# Patient Record
Sex: Female | Born: 1935
Health system: Southern US, Community
[De-identification: ages and names within clinical notes are randomized; demographics above are authoritative.]

## PROBLEM LIST (undated history)

## (undated) DIAGNOSIS — K648 Other hemorrhoids: Secondary | ICD-10-CM

## (undated) DIAGNOSIS — I499 Cardiac arrhythmia, unspecified: Secondary | ICD-10-CM

## (undated) DIAGNOSIS — M1990 Unspecified osteoarthritis, unspecified site: Secondary | ICD-10-CM

## (undated) DIAGNOSIS — E785 Hyperlipidemia, unspecified: Secondary | ICD-10-CM

## (undated) DIAGNOSIS — R1310 Dysphagia, unspecified: Secondary | ICD-10-CM

## (undated) DIAGNOSIS — R350 Frequency of micturition: Secondary | ICD-10-CM

## (undated) DIAGNOSIS — I10 Essential (primary) hypertension: Secondary | ICD-10-CM

## (undated) DIAGNOSIS — R609 Edema, unspecified: Secondary | ICD-10-CM

## (undated) DIAGNOSIS — C4490 Unspecified malignant neoplasm of skin, unspecified: Secondary | ICD-10-CM

## (undated) DIAGNOSIS — N8110 Cystocele, unspecified: Secondary | ICD-10-CM

## (undated) DIAGNOSIS — I82439 Acute embolism and thrombosis of unspecified popliteal vein: Secondary | ICD-10-CM

## (undated) DIAGNOSIS — K219 Gastro-esophageal reflux disease without esophagitis: Secondary | ICD-10-CM

## (undated) DIAGNOSIS — F411 Generalized anxiety disorder: Secondary | ICD-10-CM

## (undated) DIAGNOSIS — E559 Vitamin D deficiency, unspecified: Secondary | ICD-10-CM

## (undated) DIAGNOSIS — M810 Age-related osteoporosis without current pathological fracture: Secondary | ICD-10-CM

## (undated) DIAGNOSIS — R1013 Epigastric pain: Secondary | ICD-10-CM

## (undated) DIAGNOSIS — K3189 Other diseases of stomach and duodenum: Secondary | ICD-10-CM

## (undated) DIAGNOSIS — G309 Alzheimer's disease, unspecified: Secondary | ICD-10-CM

## (undated) DIAGNOSIS — G2581 Restless legs syndrome: Secondary | ICD-10-CM

## (undated) DIAGNOSIS — F0280 Dementia in other diseases classified elsewhere without behavioral disturbance: Secondary | ICD-10-CM

## (undated) DIAGNOSIS — D649 Anemia, unspecified: Secondary | ICD-10-CM

## (undated) DIAGNOSIS — D496 Neoplasm of unspecified behavior of brain: Secondary | ICD-10-CM

## (undated) HISTORY — DX: Frequency of micturition: R35.0

## (undated) HISTORY — DX: Cystocele, unspecified: N81.10

## (undated) HISTORY — DX: Generalized anxiety disorder: F41.1

## (undated) HISTORY — DX: Hyperlipidemia, unspecified: E78.5

## (undated) HISTORY — DX: Other diseases of stomach and duodenum: K31.89

## (undated) HISTORY — DX: Edema, unspecified: R60.9

## (undated) HISTORY — DX: Vitamin D deficiency, unspecified: E55.9

## (undated) HISTORY — DX: Epigastric pain: R10.13

## (undated) HISTORY — PX: ESOPHAGEAL DILATION: SHX303

## (undated) HISTORY — DX: Alzheimer's disease, unspecified: G30.9

## (undated) HISTORY — DX: Neoplasm of unspecified behavior of brain: D49.6

## (undated) HISTORY — DX: Anemia, unspecified: D64.9

## (undated) HISTORY — DX: Age-related osteoporosis without current pathological fracture: M81.0

## (undated) HISTORY — DX: Unspecified malignant neoplasm of skin, unspecified: C44.90

## (undated) HISTORY — DX: Other hemorrhoids: K64.8

## (undated) HISTORY — DX: Dysphagia, unspecified: R13.10

## (undated) HISTORY — DX: Dementia in other diseases classified elsewhere, unspecified severity, without behavioral disturbance, psychotic disturbance, mood disturbance, and anxiety: F02.80

## (undated) HISTORY — PX: EYE SURGERY: SHX253

## (undated) HISTORY — DX: Unspecified osteoarthritis, unspecified site: M19.90

## (undated) HISTORY — DX: Restless legs syndrome: G25.81

---
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
## 1970-05-04 HISTORY — PX: APPENDECTOMY: SHX54

## 2000-03-29 ENCOUNTER — Encounter: Payer: Self-pay | Admitting: Internal Medicine

## 2000-03-29 ENCOUNTER — Encounter: Admission: RE | Admit: 2000-03-29 | Discharge: 2000-03-29 | Payer: Self-pay | Admitting: Internal Medicine

## 2001-06-02 ENCOUNTER — Encounter: Payer: Self-pay | Admitting: Gastroenterology

## 2001-06-02 ENCOUNTER — Ambulatory Visit (HOSPITAL_COMMUNITY): Admission: RE | Admit: 2001-06-02 | Discharge: 2001-06-02 | Payer: Self-pay | Admitting: Gastroenterology

## 2001-09-02 ENCOUNTER — Ambulatory Visit (HOSPITAL_COMMUNITY): Admission: RE | Admit: 2001-09-02 | Discharge: 2001-09-02 | Payer: Self-pay | Admitting: Internal Medicine

## 2008-12-17 ENCOUNTER — Encounter: Admission: RE | Admit: 2008-12-17 | Discharge: 2009-01-17 | Payer: Self-pay | Admitting: Internal Medicine

## 2009-05-04 HISTORY — PX: ABDOMINAL HYSTERECTOMY: SHX81

## 2009-09-04 ENCOUNTER — Encounter (INDEPENDENT_AMBULATORY_CARE_PROVIDER_SITE_OTHER): Payer: Self-pay | Admitting: Obstetrics & Gynecology

## 2009-09-04 ENCOUNTER — Ambulatory Visit (HOSPITAL_COMMUNITY): Admission: RE | Admit: 2009-09-04 | Discharge: 2009-09-06 | Payer: Self-pay | Admitting: Obstetrics & Gynecology

## 2010-01-03 ENCOUNTER — Emergency Department (HOSPITAL_BASED_OUTPATIENT_CLINIC_OR_DEPARTMENT_OTHER): Admission: EM | Admit: 2010-01-03 | Discharge: 2010-01-03 | Payer: Self-pay | Admitting: Emergency Medicine

## 2010-05-06 ENCOUNTER — Encounter
Admission: RE | Admit: 2010-05-06 | Discharge: 2010-05-06 | Payer: Self-pay | Source: Home / Self Care | Attending: Internal Medicine | Admitting: Internal Medicine

## 2010-05-06 LAB — HM DEXA SCAN

## 2010-07-22 LAB — CBC
HCT: 31.2 % — ABNORMAL LOW (ref 36.0–46.0)
HCT: 39.6 % (ref 36.0–46.0)
Hemoglobin: 13.4 g/dL (ref 12.0–15.0)
MCHC: 33.8 g/dL (ref 30.0–36.0)
MCV: 94.4 fL (ref 78.0–100.0)
Platelets: 130 10*3/uL — ABNORMAL LOW (ref 150–400)
Platelets: 170 10*3/uL (ref 150–400)
RBC: 4.19 MIL/uL (ref 3.87–5.11)
RDW: 13.6 % (ref 11.5–15.5)
WBC: 5.9 10*3/uL (ref 4.0–10.5)
WBC: 7.2 10*3/uL (ref 4.0–10.5)

## 2010-07-22 LAB — DIFFERENTIAL
Basophils Absolute: 0 10*3/uL (ref 0.0–0.1)
Basophils Relative: 0 % (ref 0–1)
Eosinophils Absolute: 0.1 10*3/uL (ref 0.0–0.7)
Eosinophils Relative: 2 % (ref 0–5)
Lymphocytes Relative: 17 % (ref 12–46)
Lymphs Abs: 1 10*3/uL (ref 0.7–4.0)
Monocytes Absolute: 0.4 10*3/uL (ref 0.1–1.0)
Monocytes Relative: 6 % (ref 3–12)
Neutro Abs: 4.4 10*3/uL (ref 1.7–7.7)
Neutrophils Relative %: 75 % (ref 43–77)

## 2010-07-22 LAB — COMPREHENSIVE METABOLIC PANEL
ALT: 11 U/L (ref 0–35)
ALT: 15 U/L (ref 0–35)
AST: 17 U/L (ref 0–37)
AST: 18 U/L (ref 0–37)
Albumin: 2.9 g/dL — ABNORMAL LOW (ref 3.5–5.2)
Albumin: 3.9 g/dL (ref 3.5–5.2)
Alkaline Phosphatase: 55 U/L (ref 39–117)
Alkaline Phosphatase: 79 U/L (ref 39–117)
BUN: 9 mg/dL (ref 6–23)
BUN: 9 mg/dL (ref 6–23)
CO2: 26 mEq/L (ref 19–32)
Calcium: 9.7 mg/dL (ref 8.4–10.5)
Chloride: 104 mEq/L (ref 96–112)
Chloride: 105 mEq/L (ref 96–112)
Creatinine, Ser: 0.74 mg/dL (ref 0.4–1.2)
GFR calc Af Amer: >60 mL/min (ref >60)
GFR calc Af Amer: >60 mL/min (ref >60)
GFR calc non Af Amer: >60 mL/min (ref >60)
Glucose, Bld: 87 mg/dL (ref 70–99)
Potassium: 4.3 mEq/L (ref 3.5–5.1)
Potassium: 4.3 mEq/L (ref 3.5–5.1)
Sodium: 134 mEq/L — ABNORMAL LOW (ref 135–145)
Sodium: 137 mEq/L (ref 135–145)
Total Bilirubin: 0.4 mg/dL (ref 0.3–1.2)
Total Bilirubin: 0.7 mg/dL (ref 0.3–1.2)
Total Protein: 7.2 g/dL (ref 6.0–8.3)

## 2010-08-26 ENCOUNTER — Ambulatory Visit
Admission: RE | Admit: 2010-08-26 | Discharge: 2010-08-26 | Disposition: A | Discharge status: Home / Self Care | Payer: Medicare Other | Source: Ambulatory Visit | Attending: Internal Medicine | Admitting: Internal Medicine

## 2010-08-26 ENCOUNTER — Other Ambulatory Visit: Payer: Self-pay | Admitting: Internal Medicine

## 2010-08-26 DIAGNOSIS — R0600 Dyspnea, unspecified: Secondary | ICD-10-CM

## 2012-04-28 ENCOUNTER — Ambulatory Visit
Admission: RE | Admit: 2012-04-28 | Discharge: 2012-04-28 | Disposition: A | Discharge status: Home / Self Care | Payer: Medicare Other | Source: Ambulatory Visit | Attending: Gastroenterology | Admitting: Gastroenterology

## 2012-04-28 ENCOUNTER — Other Ambulatory Visit: Payer: Self-pay | Admitting: Gastroenterology

## 2012-04-28 DIAGNOSIS — R1310 Dysphagia, unspecified: Secondary | ICD-10-CM

## 2012-05-03 ENCOUNTER — Other Ambulatory Visit: Payer: Self-pay | Admitting: Gastroenterology

## 2012-05-05 ENCOUNTER — Encounter (HOSPITAL_COMMUNITY): Payer: Self-pay | Admitting: *Deleted

## 2012-05-05 NOTE — Pre-Procedure Instructions (Signed)
Your procedure is scheduled WR:[id number], May 17, 2012 Report to [facility] Admitting WJ:[id number] Call this number if you have problems morning of your procedure:[phone]  Follow all bowel prep instructions per your doctor's orders.  Do not eat or drink anything after midnight the night before your procedure. You may brush your teeth, rinse out your mouth, but no water, no food, no chewing gum, no mints, no candies, no chewing tobacco.     Take these medicines the morning of your procedure with A SIP OF WATER:Prilosec   Please make arrangements for a responsible person to drive you home after the procedure. You cannot go home by cab/taxi. We recommend you have someone with you at home the first 24 hours after your procedure. Driver for procedure is Water engineer and son Acey Lav ALL VALUABLES, JEWELRY, BILLFOLD AT HOME.  NO DENTURES, CONTACT LENSES ALLOWED IN THE ENDOSCOPY ROOM.   YOU MAY WEAR DEODORANT, PLEASE REMOVE ALL JEWELRY, WATCHES RINGS, BODY PIERCINGS AND LEAVE AT HOME.   WOMEN: NO MAKE-UP, LOTIONS PERFUMES

## 2012-05-06 ENCOUNTER — Encounter (HOSPITAL_COMMUNITY): Payer: Self-pay | Admitting: Pharmacy Technician

## 2012-05-06 ENCOUNTER — Encounter (HOSPITAL_COMMUNITY): Payer: Self-pay | Admitting: *Deleted

## 2012-05-17 ENCOUNTER — Ambulatory Visit (HOSPITAL_COMMUNITY)
Admission: RE | Admit: 2012-05-17 | Discharge: 2012-05-17 | Disposition: A | Discharge status: Home / Self Care | Payer: Medicare Other | Source: Ambulatory Visit | Attending: Gastroenterology | Admitting: Gastroenterology

## 2012-05-17 ENCOUNTER — Encounter (HOSPITAL_COMMUNITY)
Admission: RE | Disposition: A | Discharge status: Home / Self Care | Payer: Self-pay | Source: Ambulatory Visit | Attending: Gastroenterology

## 2012-05-17 ENCOUNTER — Ambulatory Visit (HOSPITAL_COMMUNITY): Payer: Medicare Other | Admitting: Anesthesiology

## 2012-05-17 ENCOUNTER — Encounter (HOSPITAL_COMMUNITY): Payer: Self-pay | Admitting: Anesthesiology

## 2012-05-17 ENCOUNTER — Encounter (HOSPITAL_COMMUNITY): Payer: Self-pay | Admitting: Gastroenterology

## 2012-05-17 DIAGNOSIS — K222 Esophageal obstruction: Secondary | ICD-10-CM | POA: Insufficient documentation

## 2012-05-17 DIAGNOSIS — Z79899 Other long term (current) drug therapy: Secondary | ICD-10-CM | POA: Insufficient documentation

## 2012-05-17 DIAGNOSIS — R1310 Dysphagia, unspecified: Secondary | ICD-10-CM | POA: Insufficient documentation

## 2012-05-17 DIAGNOSIS — K220 Achalasia of cardia: Secondary | ICD-10-CM | POA: Insufficient documentation

## 2012-05-17 HISTORY — PX: ESOPHAGOGASTRODUODENOSCOPY (EGD) WITH PROPOFOL: SHX5813

## 2012-05-17 HISTORY — DX: Cardiac arrhythmia, unspecified: I49.9

## 2012-05-17 HISTORY — DX: Essential (primary) hypertension: I10

## 2012-05-17 HISTORY — DX: Gastro-esophageal reflux disease without esophagitis: K21.9

## 2012-05-17 SURGERY — ESOPHAGOGASTRODUODENOSCOPY (EGD) WITH PROPOFOL
Anesthesia: Monitor Anesthesia Care

## 2012-05-17 MED ORDER — BUTAMBEN-TETRACAINE-BENZOCAINE 2-2-14 % EX AERO
INHALATION_SPRAY | CUTANEOUS | Status: DC | PRN
  Administered 2012-05-17: 2 via TOPICAL

## 2012-05-17 MED ORDER — SODIUM CHLORIDE 0.9 % IV SOLN: INTRAVENOUS | Status: DC

## 2012-05-17 MED ORDER — FENTANYL CITRATE 0.05 MG/ML IJ SOLN
INTRAMUSCULAR | Status: DC | PRN
  Administered 2012-05-17: 25 ug via INTRAVENOUS

## 2012-05-17 MED ORDER — ONABOTULINUMTOXINA 100 UNITS IJ SOLR
100.0000 [IU] | INTRAMUSCULAR | Status: AC
  Administered 2012-05-17: 100 [IU] via INTRAMUSCULAR
  Filled 2012-05-17: qty 100

## 2012-05-17 MED ORDER — PROPOFOL 10 MG/ML IV EMUL
INTRAVENOUS | Status: DC | PRN
  Administered 2012-05-17: 50 ug/kg/min via INTRAVENOUS

## 2012-05-17 MED ORDER — LACTATED RINGERS IV SOLN
INTRAVENOUS | Status: DC
  Administered 2012-05-17: 1000 mL via INTRAVENOUS

## 2012-05-17 MED ORDER — LACTATED RINGERS IV SOLN
INTRAVENOUS | Status: DC
  Administered 2012-05-17: 14:00:00 via INTRAVENOUS

## 2012-05-17 MED ORDER — FENTANYL CITRATE 0.05 MG/ML IJ SOLN: 25.0000 ug | INTRAMUSCULAR | Status: DC | PRN

## 2012-05-17 SURGICAL SUPPLY — 15 items

## 2012-05-17 NOTE — Anesthesia Postprocedure Evaluation (Signed)
  Anesthesia Post-op Note  Patient: Cynthia Shepard  Procedure(s) Performed: Procedure(s) (LRB): ESOPHAGOGASTRODUODENOSCOPY (EGD) WITH PROPOFOL (N/A)  Patient Location: PACU  Anesthesia Type: MAC  Level of Consciousness: awake and alert   Airway and Oxygen Therapy: Patient Spontanous Breathing  Post-op Pain: mild  Post-op Assessment: Post-op Vital signs reviewed, Patient's Cardiovascular Status Stable, Respiratory Function Stable, Patent Airway and No signs of Nausea or vomiting  Last Vitals:  Filed Vitals:   05/17/12 1349  BP: 159/62  Pulse: 54  Temp: 36.6 C  Resp: 17    Post-op Vital Signs: stable   Complications: No apparent anesthesia complications

## 2012-05-17 NOTE — Op Note (Signed)
Procedure: Diagnostic esophagogastroduodenoscopy, Botox injection into the lower esophageal sphincter muscle, balloon esophageal dilation.  Indication: Dysphagia post Heller myotomy for achalasia  Endoscopist: Danise Edge  Premedication: Propofol administered by anesthesia  Procedure: The patient was placed in the left lateral decubitus position. The Pentax gastroscope was passed through the posterior hypopharynx into the proximal esophagus without difficulty. The hypopharynx, larynx, and vocal cords were not visualized.  Esophagoscopy: The esophageal body is quite dilated with retained clear liquid. The proximal, mid, and lower segments of the esophageal mucosa appear normal except for erosive esophagitis at the esophagogastric junction which is noted at approximately 40 cm from the incisor teeth. There is no evidence for the presence of an esophageal stricture or esophageal tumor.  Gastroscopy: Retroflex view of the gastric cardia and fundus was normal. The gastric body, antrum, and pylorus appeared normal.  Duodenoscopy: The duodenal bulb and descending duodenum appeared normal.  Botox injection: A total of 100 units of Botox was injected at the esophagogastric junction into the lower esophageal sphincter muscle. Four-quadrant injections were performed using 25 units per injection.  Esophageal dilation: Balloon esophageal dilation was performed to 18 mm using the esophageal balloon dilator and confirm the absence of an esophageal stricture.  Assessment: Achalasia associated with distal esophageal obstruction at the esophagogastric junction treated with Botox injection. There is no endoscopic evidence for the presence of esophageal stricture formation or esophageal cancer.

## 2012-05-17 NOTE — Anesthesia Preprocedure Evaluation (Signed)
Anesthesia Evaluation  Patient identified by MRN, date of birth, ID band Patient awake    Reviewed: Allergy & Precautions, H&P , NPO status , Patient's Chart, lab work & pertinent test results, reviewed documented beta blocker date and time   Airway Mallampati: II TM Distance: >3 FB Neck ROM: full    Dental No notable dental hx.    Pulmonary neg pulmonary ROS,  breath sounds clear to auscultation  Pulmonary exam normal       Cardiovascular hypertension, Pt. on medications Rhythm:regular Rate:Normal  Hx. bradycardia   Neuro/Psych negative neurological ROS  negative psych ROS   GI/Hepatic negative GI ROS, Neg liver ROS, GERD-  Controlled,  Endo/Other  negative endocrine ROS  Renal/GU negative Renal ROS  negative genitourinary   Musculoskeletal   Abdominal   Peds  Hematology negative hematology ROS (+)   Anesthesia Other Findings   Reproductive/Obstetrics negative OB ROS                           Anesthesia Physical Anesthesia Plan  ASA: III  Anesthesia Plan: MAC   Post-op Pain Management:    Induction:   Airway Management Planned:   Additional Equipment:   Intra-op Plan:   Post-operative Plan:   Informed Consent: I have reviewed the patients History and Physical, chart, labs and discussed the procedure including the risks, benefits and alternatives for the proposed anesthesia with the patient or authorized representative who has indicated his/her understanding and acceptance.   Dental Advisory Given  Plan Discussed with: CRNA and Surgeon  Anesthesia Plan Comments:         Anesthesia Quick Evaluation

## 2012-05-17 NOTE — Transfer of Care (Signed)
Immediate Anesthesia Transfer of Care Note  Patient: Cynthia Shepard  Procedure(s) Performed: Procedure(s) (LRB): ESOPHAGOGASTRODUODENOSCOPY (EGD) WITH PROPOFOL (N/A)  Patient Location: PACU  Anesthesia Type: MAC  Level of Consciousness: sedated, patient cooperative and responds to stimulaton  Airway & Oxygen Therapy: Patient Spontanous Breathing and Patient connected to face mask oxgen  Post-op Assessment: Report given to PACU RN and Post -op Vital signs reviewed and stable  Post vital signs: Reviewed and stable  Complications: No apparent anesthesia complications

## 2012-05-17 NOTE — H&P (Signed)
  Problem: Dysphagia with a distal esophageal obstruction.  History: The patient is a 79 year old female born 1919-09-28. The patient underwent a Heller myotomy to treat achalasia in the 1950s. She underwent a diagnostic esophagogastroduodenoscopy with dilation of a benign peptic stricture at the esophagogastric junction in 2003.  The patient has developed esophageal dysphagia without odynophagia, nausea, or vomiting. She was recently prescribed omeprazole.  The patient underwent a barium esophagram which showed distal esophageal obstruction at the esophagogastric junction most likely secondary to a stricture and aperistalsis of the esophagus associated with achalasia.  The patient is scheduled to undergo a diagnostic esophagogastroduodenoscopy with esophageal dilation.  Current medications: Omeprazole  Past medical and surgical history: Achalasia. Heller myotomy. Hysterectomy. Distal esophageal stricture. History of chronic  Exam: The patient is alert and lying comfortably on the endoscopy stretcher. Lungs are clear to auscultation. Cardiac exam reveals a regular rhythm. Abdomen is soft, flat, and nontender to palpation in all quadrants.  Plan: Proceed with diagnostic esophagogastroduodenoscopy and distal esophageal dilation to evaluate and treat distal esophageal obstruction.

## 2012-05-18 ENCOUNTER — Encounter (HOSPITAL_COMMUNITY): Payer: Self-pay | Admitting: Gastroenterology

## 2012-10-10 ENCOUNTER — Other Ambulatory Visit (HOSPITAL_BASED_OUTPATIENT_CLINIC_OR_DEPARTMENT_OTHER): Payer: Self-pay | Admitting: Internal Medicine

## 2012-11-01 ENCOUNTER — Encounter: Payer: Self-pay | Admitting: *Deleted

## 2012-11-02 ENCOUNTER — Encounter: Payer: Self-pay | Admitting: Nurse Practitioner

## 2012-11-02 ENCOUNTER — Other Ambulatory Visit: Payer: Self-pay | Admitting: *Deleted

## 2012-11-02 ENCOUNTER — Ambulatory Visit (INDEPENDENT_AMBULATORY_CARE_PROVIDER_SITE_OTHER): Payer: Medicare Other | Admitting: Nurse Practitioner

## 2012-11-02 VITALS — BP 160/82 | HR 75 | Temp 97.4°F | Resp 22 | Ht 66.0 in | Wt 193.0 lb

## 2012-11-02 DIAGNOSIS — F411 Generalized anxiety disorder: Secondary | ICD-10-CM

## 2012-11-02 DIAGNOSIS — I10 Essential (primary) hypertension: Secondary | ICD-10-CM

## 2012-11-02 DIAGNOSIS — K5900 Constipation, unspecified: Secondary | ICD-10-CM

## 2012-11-02 DIAGNOSIS — K921 Melena: Secondary | ICD-10-CM

## 2012-11-02 MED ORDER — CITALOPRAM HYDROBROMIDE 20 MG PO TABS: 20.0000 mg | ORAL_TABLET | Freq: Every day | ORAL | Status: DC

## 2012-11-02 MED ORDER — ALPRAZOLAM 0.25 MG PO TABS: 0.1250 mg | ORAL_TABLET | Freq: Two times a day (BID) | ORAL | Status: DC

## 2012-11-02 MED ORDER — ALPRAZOLAM 0.25 MG PO TABS: 0.1250 mg | ORAL_TABLET | Freq: Every evening | ORAL | Status: DC | PRN

## 2012-11-02 NOTE — Patient Instructions (Addendum)
Will get a referral to gastroenterology due to blood in stool    Take a stool softener (colace) 100 mg twice daily to help with constipation Increase water intake    Will start daily medication to help anxiety -- this is at the pharmacy     Constipation, Adult Constipation is when a person has fewer than 3 bowel movements a week; has difficulty having a bowel movement; or has stools that are dry, hard, or larger than normal. As people grow older, constipation is more common. If you try to fix constipation with medicines that make you have a bowel movement (laxatives), the problem may get worse. Long-term laxative use may cause the muscles of the colon to become weak. A low-fiber diet, not taking in enough fluids, and taking certain medicines may make constipation worse. CAUSES   Certain medicines, such as antidepressants, pain medicine, iron supplements, antacids, and water pills.   Certain diseases, such as diabetes, irritable bowel syndrome (IBS), thyroid disease, or depression.   Not drinking enough water.   Not eating enough fiber-rich foods.   Stress or travel.  Lack of physical activity or exercise.  Not going to the restroom when there is the urge to have a bowel movement.  Ignoring the urge to have a bowel movement.  Using laxatives too much. SYMPTOMS   Having fewer than 3 bowel movements a week.   Straining to have a bowel movement.   Having hard, dry, or larger than normal stools.   Feeling full or bloated.   Pain in the lower abdomen.  Not feeling relief after having a bowel movement. DIAGNOSIS  Your caregiver will take a medical history and perform a physical exam. Further testing may be done for severe constipation. Some tests may include:   A barium enema X-ray to examine your rectum, colon, and sometimes, your small intestine.  A sigmoidoscopy to examine your lower colon.  A colonoscopy to examine your entire colon. TREATMENT  Treatment  will depend on the severity of your constipation and what is causing it. Some dietary treatments include drinking more fluids and eating more fiber-rich foods. Lifestyle treatments may include regular exercise. If these diet and lifestyle recommendations do not help, your caregiver may recommend taking over-the-counter laxative medicines to help you have bowel movements. Prescription medicines may be prescribed if over-the-counter medicines do not work.  HOME CARE INSTRUCTIONS   Increase dietary fiber in your diet, such as fruits, vegetables, whole grains, and beans. Limit high-fat and processed sugars in your diet, such as [place] fries, hamburgers, cookies, candies, and soda.   A fiber supplement may be added to your diet if you cannot get enough fiber from foods.   Drink enough fluids to keep your urine clear or pale yellow.   Exercise regularly or as directed by your caregiver.   Go to the restroom when you have the urge to go. Do not hold it.  Only take medicines as directed by your caregiver. Do not take other medicines for constipation without talking to your caregiver first. SEEK IMMEDIATE MEDICAL CARE IF:   You have bright red blood in your stool.   Your constipation lasts for more than 4 days or gets worse.   You have abdominal or rectal pain.   You have thin, pencil-like stools.  You have unexplained weight loss. MAKE SURE YOU:   Understand these instructions.  Will watch your condition.  Will get help right away if you are not doing well or get worse. Document  Released: 01/17/2004 Document Revised: 07/13/2011 Document Reviewed: 03/24/2011 [facility] Patient Information 2014 [place], [place].

## 2012-11-02 NOTE — Progress Notes (Signed)
Patient ID: Cynthia Shepard, female   DOB: 1919/07/17, 79 y.o.   MRN: [id number]   No Known Allergies  Chief Complaint  Patient presents with  . Medical Managment of Chronic Issues    no new problems    HPI: Patient is a 79 y.o. female seen in the office today for medication refill Has not been here in several months was a pt of Dr Robson's  Needs refill on xanax to help her sleep- reports she still has a few and takes 1/2 tablet every night If she goes without she is awake all night. Reports she also takes xanax when she has a panic attack which is not very often but she likes to have them because she is a nervous person. Reports this effects her everyday and contributes to why she can not sleep. And sometimes is depressed but mostly nevous  Sees Dr Jacinto Halim for cardiology and reports she needs to follow up with him    Reports everything is "fine" and there are no new concerns however she reports she is having blood in her stool for over a month now, which is on and off. Reports moderate amount in toilet and on tissue. No history of hemorrhoids per pt Denies lightheadedness, dizziness, nausea or vomiting, no fevers or chills    Review of Systems:  Review of Systems  Constitutional: Negative for fever and chills.       Some days she feels weak and does not want to do anything.  Respiratory: Positive for shortness of breath (with increase activity- this is chronic). Negative for cough.   Cardiovascular: Positive for leg swelling. Negative for chest pain.  Gastrointestinal: Positive for constipation. Negative for abdominal pain and diarrhea.  Genitourinary: Negative for dysuria, urgency and frequency.  Musculoskeletal: Negative.   Skin: Negative.   Neurological: Negative for dizziness, weakness and headaches.  Psychiatric/Behavioral: Positive for depression. The patient is nervous/anxious and has insomnia.      Past Medical History  Diagnosis Date  . Hypertension   . GERD  (gastroesophageal reflux disease)   . Arthritis   . Dysrhythmia     Bradycardia, 1st degree AV block  . Dyspepsia and other specified disorders of function of stomach   . Other dyspnea and respiratory abnormality   . Other malaise and fatigue   . Osteoporosis, unspecified   . Unspecified prolapse of vaginal walls   . Edema   . Alzheimer's disease   . Other and unspecified hyperlipidemia   . Unspecified vitamin D deficiency   . Osteoarthrosis, unspecified whether generalized or localized, unspecified site   . Anemia, unspecified   . Anxiety state, unspecified   . Restless legs syndrome (RLS)   . Internal hemorrhoids without mention of complication   . Dysphagia, unspecified(787.20)   . Urinary frequency   . Nervousness(799.21)    Past Surgical History  Procedure Laterality Date  . Abdominal hysterectomy  2011  . Eye surgery      catarct extraction bilateral  . Esophagogastroduodenoscopy (egd) with propofol  05/17/2012    Procedure: ESOPHAGOGASTRODUODENOSCOPY (EGD) WITH PROPOFOL;  Surgeon: Charolett Bumpers, MD;  Location: [facility];  Service: Endoscopy;  Laterality: N/A;  . Thyroid surgery  1954  . Appendectomy  1972   Social History:   reports that she has never smoked. She has never used smokeless tobacco. She reports that she does not drink alcohol or use illicit drugs.  No family history on file.  Medications: Patient's Medications  New Prescriptions  No medications on file  Previous Medications   ALISKIREN (TEKTURNA) 150 MG TABLET    Take 150 mg by mouth every morning.   HYDRALAZINE (APRESOLINE) 25 MG TABLET    Take 25 mg by mouth 2 (two) times daily.  Modified Medications   Modified Medication Previous Medication   ALPRAZOLAM (XANAX) 0.25 MG TABLET ALPRAZolam (XANAX) 0.25 MG tablet      Take 0.5 tablets (0.125 mg total) by mouth 2 (two) times daily.    Take 0.125 mg by mouth 2 (two) times daily.  Discontinued Medications   OMEPRAZOLE (PRILOSEC) 20 MG CAPSULE     Take 20 mg by mouth every morning.     Physical Exam:  Filed Vitals:   11/02/12 1529  BP: 160/82  Pulse: 75  Temp: 97.4 F (36.3 C)  TempSrc: Oral  Resp: 22  Height: 5\' 6"  (1.676 m)  Weight: 193 lb (87.544 kg)  SpO2: 96%    Physical Exam  Vitals reviewed. Constitutional: She is oriented to person, place, and time and well-developed, well-nourished, and in no distress. No distress.  HENT:  Head: Normocephalic and atraumatic.  Mouth/Throat: Oropharynx is clear and moist. No oropharyngeal exudate.  Eyes: Conjunctivae and EOM are normal. Pupils are equal, round, and reactive to light.  Neck: Normal range of motion. Neck supple. No thyromegaly present.  Cardiovascular: Normal rate, regular rhythm and normal heart sounds.   Pulmonary/Chest: Effort normal and breath sounds normal.  Abdominal: Soft. Bowel sounds are normal. She exhibits no distension. There is no tenderness.  Genitourinary: Rectal exam shows no external hemorrhoid. Guaiac positive stool.  Musculoskeletal: Normal range of motion. She exhibits no edema and no tenderness.  Neurological: She is alert and oriented to person, place, and time.  Skin: Skin is warm and dry. She is not diaphoretic.       Assessment/Plan Melena-   Grossly positive for blood in stool- will send to GI at this time; will get cbc Anxiety- will refill xanax 0.25 mg 1/2 tablet qhs and as needed for anxiety attacks  Will also start celexa to help with daily anxiety  Hypertension-   Will get blood work today cont current medications Constipation-   Take a stool softener (colace) 100 mg twice daily to help with constipation  Increase water intake    To follow up in 1 month

## 2012-11-03 LAB — COMPREHENSIVE METABOLIC PANEL
Albumin/Globulin Ratio: 1.7 (ref 1.1–2.5)
Albumin: 4.3 g/dL (ref 3.2–4.6)
BUN/Creatinine Ratio: 16 (ref 11–26)
BUN: 13 mg/dL (ref 10–36)
CO2: 25 mmol/L (ref 18–29)
Calcium: 9.4 mg/dL (ref 8.6–10.2)
Creatinine, Ser: 0.79 mg/dL (ref 0.57–1.00)
GFR calc non Af Amer: 66 mL/min/{1.73_m2} (ref >59)
Globulin, Total: 2.6 g/dL (ref 1.5–4.5)
Sodium: 140 mmol/L (ref 134–144)

## 2012-11-03 LAB — CBC WITH DIFFERENTIAL
Basos: 0 % (ref 0–3)
Eos: 1 % (ref 0–5)
Eosinophils Absolute: 0.1 10*3/uL (ref 0.0–0.4)
HCT: 36.4 % (ref 34.0–46.6)
Hemoglobin: 11.8 g/dL (ref 11.1–15.9)
Immature Grans (Abs): 0 10*3/uL (ref 0.0–0.1)
MCH: 30.5 pg (ref 26.6–33.0)
MCHC: 32.4 g/dL (ref 31.5–35.7)
Monocytes Absolute: 0.5 10*3/uL (ref 0.1–0.9)
Neutrophils Absolute: 4.2 10*3/uL (ref 1.4–7.0)
Neutrophils Relative %: 70 % (ref 40–74)
RBC: 3.87 x10E6/uL (ref 3.77–5.28)

## 2012-11-03 LAB — TSH: TSH: 2.04 u[IU]/mL (ref 0.450–4.500)

## 2012-11-08 DIAGNOSIS — F411 Generalized anxiety disorder: Secondary | ICD-10-CM | POA: Insufficient documentation

## 2012-12-07 ENCOUNTER — Ambulatory Visit: Payer: Medicare Other | Admitting: Nurse Practitioner

## 2012-12-22 ENCOUNTER — Other Ambulatory Visit: Payer: Self-pay | Admitting: Gastroenterology

## 2013-02-01 ENCOUNTER — Encounter: Payer: Self-pay | Admitting: Nurse Practitioner

## 2013-02-01 ENCOUNTER — Ambulatory Visit (INDEPENDENT_AMBULATORY_CARE_PROVIDER_SITE_OTHER): Payer: Medicare Other | Admitting: Nurse Practitioner

## 2013-02-01 VITALS — BP 164/78 | HR 74 | Temp 98.0°F | Wt 192.4 lb

## 2013-02-01 DIAGNOSIS — Z23 Encounter for immunization: Secondary | ICD-10-CM

## 2013-02-01 DIAGNOSIS — I10 Essential (primary) hypertension: Secondary | ICD-10-CM | POA: Insufficient documentation

## 2013-02-01 DIAGNOSIS — K921 Melena: Secondary | ICD-10-CM

## 2013-02-01 DIAGNOSIS — F411 Generalized anxiety disorder: Secondary | ICD-10-CM

## 2013-02-01 DIAGNOSIS — K219 Gastro-esophageal reflux disease without esophagitis: Secondary | ICD-10-CM

## 2013-02-01 MED ORDER — PANTOPRAZOLE SODIUM 40 MG PO TBEC: 40.0000 mg | DELAYED_RELEASE_TABLET | Freq: Every day | ORAL | Status: DC

## 2013-02-01 MED ORDER — CITALOPRAM HYDROBROMIDE 20 MG PO TABS: 20.0000 mg | ORAL_TABLET | Freq: Every day | ORAL | Status: DC

## 2013-02-01 NOTE — Progress Notes (Signed)
Patient ID: Cynthia Shepard, female   DOB: [birth year], 79 y.o.   MRN: [id number]   No Known Allergies  Chief Complaint  Patient presents with  . Medical Managment of Chronic Issues    follow-up form Dr Laural Benes    HPI: Patient is a 79 y.o. female seen in the office today for routine follow up.  Since her last visit she was seen with GI due to melena; this has since resolved (was following with Dr Laural Benes at [facility]) and she has no no recurrent episodes.  Was started on celexa and reports this has helped with her anxiety; has only needed one tablet of  xanax since she has been on celexa.  Has followed up with Dr Jacinto Halim in the past; reports she does not wish to go back  Review of Systems:  Review of Systems  Constitutional: Negative for fever, chills and malaise/fatigue.  Respiratory: Positive for cough (dry cough) and shortness of breath (chronic shortness of breath).   Cardiovascular: Negative for chest pain.  Gastrointestinal: Positive for heartburn. Negative for diarrhea and constipation.  Genitourinary: Negative for dysuria, urgency and frequency.  Musculoskeletal: Negative for myalgias, back pain and falls.  Neurological: Negative for weakness.  Psychiatric/Behavioral: Negative for depression. The patient has insomnia (some nights are better than others). The patient is not nervous/anxious.      Past Medical History  Diagnosis Date  . Hypertension   . GERD (gastroesophageal reflux disease)   . Arthritis   . Dysrhythmia     Bradycardia, 1st degree AV block  . Dyspepsia and other specified disorders of function of stomach   . Other dyspnea and respiratory abnormality   . Other malaise and fatigue   . Osteoporosis, unspecified   . Unspecified prolapse of vaginal walls   . Edema   . Alzheimer's disease   . Other and unspecified hyperlipidemia   . Unspecified vitamin D deficiency   . Osteoarthrosis, unspecified whether generalized or localized, unspecified site   .  Anemia, unspecified   . Anxiety state, unspecified   . Restless legs syndrome (RLS)   . Internal hemorrhoids without mention of complication   . Dysphagia, unspecified(787.20)   . Urinary frequency   . Nervousness(799.21)    Past Surgical History  Procedure Laterality Date  . Abdominal hysterectomy  2011  . Eye surgery      catarct extraction bilateral  . Esophagogastroduodenoscopy (egd) with propofol  05/17/2012    Procedure: ESOPHAGOGASTRODUODENOSCOPY (EGD) WITH PROPOFOL;  Surgeon: Charolett Bumpers, MD;  Location: [facility];  Service: Endoscopy;  Laterality: N/A;  . Thyroid surgery  1954  . Appendectomy  1972   Social History:   reports that she has never smoked. She has never used smokeless tobacco. She reports that she does not drink alcohol or use illicit drugs.  History reviewed. No pertinent family history.  Medications: Patient's Medications  New Prescriptions   No medications on file  Previous Medications   ALISKIREN (TEKTURNA) 150 MG TABLET    Take 150 mg by mouth every morning.   ALPRAZOLAM (XANAX) 0.25 MG TABLET    Take 0.5 tablets (0.125 mg total) by mouth at bedtime as needed for sleep.   CITALOPRAM (CELEXA) 20 MG TABLET    Take 1 tablet (20 mg total) by mouth daily.   HYDRALAZINE (APRESOLINE) 25 MG TABLET    Take 25 mg by mouth 2 (two) times daily.  Modified Medications   No medications on file  Discontinued Medications   No medications  on file     Physical Exam:  Filed Vitals:   02/01/13 1409  BP: 164/78  Pulse: 74  Temp: 98 F (36.7 C)  TempSrc: Oral  Weight: 192 lb 6.4 oz (87.272 kg)  SpO2: 98%   Physical Exam  Constitutional: She is oriented to person, place, and time and well-developed, well-nourished, and in no distress. No distress.  HENT:  Head: Normocephalic.  Mouth/Throat: Oropharynx is clear and moist. No oropharyngeal exudate.  Eyes: Conjunctivae and EOM are normal. Pupils are equal, round, and reactive to light.  Neck: Normal  range of motion. Neck supple.  Cardiovascular: Normal rate, regular rhythm and normal heart sounds.   Pulmonary/Chest: Effort normal and breath sounds normal. No respiratory distress.  Abdominal: Soft. Bowel sounds are normal. She exhibits no distension.  Musculoskeletal: Normal range of motion. She exhibits no edema and no tenderness.  Neurological: She is alert and oriented to person, place, and time.  Skin: Skin is warm and dry. She is not diaphoretic.  Psychiatric: Affect normal.     Labs reviewed: Basic Metabolic Panel:  Recent Labs  95/62/13 1655  NA 140  K 4.2  CL 99  CO2 25  GLUCOSE 78  BUN 13  CREATININE 0.79  CALCIUM 9.4  TSH 2.040   Liver Function Tests:  Recent Labs  11/02/12 1655  AST 18  ALT 12  ALKPHOS 84  BILITOT 0.2  PROT 6.9   No results found for this basename: LIPASE, AMYLASE,  in the last 8760 hours No results found for this basename: AMMONIA,  in the last 8760 hours CBC:  Recent Labs  11/02/12 1655  WBC 6.0  NEUTROABS 4.2  HGB 11.8  HCT 36.4  MCV 94  PLT 190    Assessment/Plan 1. Essential hypertension, benign - improved on recheck to 144/80; pt reports it is 120s at home when she takes it -cont current medications -low sodium diet  2. GERD (gastroesophageal reflux disease) - pantoprazole (PROTONIX) 40 MG tablet; Take 1 tablet (40 mg total) by mouth daily.  Dispense: 30 tablet; Refill: 3 - will start protonix to help with indigestion and may help with cough    3. Anxiety state, unspecified -improved on celexa with minimal use of xanax  - citalopram (CELEXA) 20 MG tablet; Take 1 tablet (20 mg total) by mouth daily.  Dispense: 30 tablet; Refill: 3   4. Blood in stool Resolved; no recurrent episodes   Will have pt follow up in 3 months for EV with MMSE and fasting labs before visit

## 2013-02-01 NOTE — Patient Instructions (Signed)
Will start you on protonix 40 mg you will need to take this daily for indigestion and cough -- this has been sent to your pharmacy  Will refill your celexa   Please return to the office in 3 months for a extended visit- we will get fasting blood work a few days before your appt

## 2013-02-27 ENCOUNTER — Other Ambulatory Visit: Payer: Self-pay | Admitting: *Deleted

## 2013-02-27 MED ORDER — HYDRALAZINE HCL 25 MG PO TABS: ORAL_TABLET | ORAL | Status: DC

## 2013-03-06 ENCOUNTER — Other Ambulatory Visit: Payer: Self-pay | Admitting: *Deleted

## 2013-05-08 ENCOUNTER — Other Ambulatory Visit: Payer: Medicare Other

## 2013-05-08 DIAGNOSIS — I10 Essential (primary) hypertension: Secondary | ICD-10-CM

## 2013-05-09 ENCOUNTER — Other Ambulatory Visit: Payer: Self-pay | Admitting: Gastroenterology

## 2013-05-09 DIAGNOSIS — R1310 Dysphagia, unspecified: Secondary | ICD-10-CM

## 2013-05-09 LAB — CBC WITH DIFFERENTIAL
BASOS ABS: 0 10*3/uL (ref 0.0–0.2)
Basos: 0 %
Eos: 2 %
Eosinophils Absolute: 0.1 10*3/uL (ref 0.0–0.4)
HEMATOCRIT: 36.4 % (ref 34.0–46.6)
Hemoglobin: 12.2 g/dL (ref 11.1–15.9)
IMMATURE GRANULOCYTES: 0 %
Immature Grans (Abs): 0 10*3/uL (ref 0.0–0.1)
LYMPHS: 23 %
Lymphocytes Absolute: 1 10*3/uL (ref 0.7–3.1)
MCH: 30.7 pg (ref 26.6–33.0)
MCHC: 33.5 g/dL (ref 31.5–35.7)
MCV: 92 fL (ref 79–97)
MONOCYTES: 10 %
Monocytes Absolute: 0.4 10*3/uL (ref 0.1–0.9)
NEUTROS PCT: 65 %
Neutrophils Absolute: 2.7 10*3/uL (ref 1.4–7.0)
PLATELETS: 228 10*3/uL (ref 150–379)
RBC: 3.98 x10E6/uL (ref 3.77–5.28)
RDW: 13.1 % (ref 12.3–15.4)
WBC: 4.2 10*3/uL (ref 3.4–10.8)

## 2013-05-09 LAB — COMPREHENSIVE METABOLIC PANEL
A/G RATIO: 1.3 (ref 1.1–2.5)
ALBUMIN: 3.7 g/dL (ref 3.2–4.6)
ALK PHOS: 74 IU/L (ref 39–117)
ALT: 12 IU/L (ref 0–32)
AST: 13 IU/L (ref 0–40)
BUN / CREAT RATIO: 10 — AB (ref 11–26)
BUN: 9 mg/dL — AB (ref 10–36)
CALCIUM: 9.7 mg/dL (ref 8.6–10.2)
CO2: 23 mmol/L (ref 18–29)
CREATININE: 0.89 mg/dL (ref 0.57–1.00)
Chloride: 100 mmol/L (ref 97–108)
GFR calc Af Amer: 66 mL/min/{1.73_m2} (ref >59)
GFR, EST NON AFRICAN AMERICAN: 57 mL/min/{1.73_m2} — AB (ref >59)
GLOBULIN, TOTAL: 2.8 g/dL (ref 1.5–4.5)
Glucose: 94 mg/dL (ref 65–99)
Potassium: 4.4 mmol/L (ref 3.5–5.2)
SODIUM: 138 mmol/L (ref 134–144)
Total Bilirubin: 0.3 mg/dL (ref 0.0–1.2)
Total Protein: 6.5 g/dL (ref 6.0–8.5)

## 2013-05-09 LAB — LIPID PANEL
CHOLESTEROL TOTAL: 189 mg/dL (ref 100–199)
Chol/HDL Ratio: 3.5 ratio units (ref 0.0–4.4)
HDL: 54 mg/dL (ref >39)
LDL Calculated: 120 mg/dL — ABNORMAL HIGH (ref 0–99)
TRIGLYCERIDES: 77 mg/dL (ref 0–149)
VLDL Cholesterol Cal: 15 mg/dL (ref 5–40)

## 2013-05-10 ENCOUNTER — Encounter: Payer: Medicare Other | Admitting: Nurse Practitioner

## 2013-05-11 ENCOUNTER — Ambulatory Visit
Admission: RE | Admit: 2013-05-11 | Discharge: 2013-05-11 | Disposition: A | Discharge status: Home / Self Care | Payer: Medicare Other | Source: Ambulatory Visit | Attending: Gastroenterology | Admitting: Gastroenterology

## 2013-05-11 DIAGNOSIS — R1310 Dysphagia, unspecified: Secondary | ICD-10-CM

## 2013-05-25 ENCOUNTER — Ambulatory Visit (INDEPENDENT_AMBULATORY_CARE_PROVIDER_SITE_OTHER): Payer: Medicare Other | Admitting: Nurse Practitioner

## 2013-05-25 ENCOUNTER — Encounter: Payer: Self-pay | Admitting: Nurse Practitioner

## 2013-05-25 VITALS — BP 118/66 | HR 55 | Resp 10 | Ht 65.5 in | Wt 180.8 lb

## 2013-05-25 DIAGNOSIS — M899 Disorder of bone, unspecified: Secondary | ICD-10-CM

## 2013-05-25 DIAGNOSIS — K219 Gastro-esophageal reflux disease without esophagitis: Secondary | ICD-10-CM

## 2013-05-25 DIAGNOSIS — R413 Other amnesia: Secondary | ICD-10-CM | POA: Insufficient documentation

## 2013-05-25 DIAGNOSIS — F411 Generalized anxiety disorder: Secondary | ICD-10-CM

## 2013-05-25 DIAGNOSIS — M949 Disorder of cartilage, unspecified: Secondary | ICD-10-CM

## 2013-05-25 DIAGNOSIS — I10 Essential (primary) hypertension: Secondary | ICD-10-CM

## 2013-05-25 DIAGNOSIS — G609 Hereditary and idiopathic neuropathy, unspecified: Secondary | ICD-10-CM

## 2013-05-25 DIAGNOSIS — M8580 Other specified disorders of bone density and structure, unspecified site: Secondary | ICD-10-CM | POA: Insufficient documentation

## 2013-05-25 MED ORDER — NYSTATIN 100000 UNIT/GM EX POWD
CUTANEOUS | Status: DC
Start: 2013-05-25 — End: 2014-04-19

## 2013-05-25 MED ORDER — HYDRALAZINE HCL 25 MG PO TABS: ORAL_TABLET | ORAL | Status: DC

## 2013-05-25 MED ORDER — PANTOPRAZOLE SODIUM 40 MG PO TBEC: 40.0000 mg | DELAYED_RELEASE_TABLET | Freq: Every day | ORAL | Status: DC

## 2013-05-25 MED ORDER — CITALOPRAM HYDROBROMIDE 20 MG PO TABS: 20.0000 mg | ORAL_TABLET | Freq: Every day | ORAL | Status: DC

## 2013-05-25 MED ORDER — GABAPENTIN 100 MG PO CAPS: ORAL_CAPSULE | ORAL | Status: DC

## 2013-05-25 NOTE — Progress Notes (Signed)
Patient ID: Cynthia Shepard, female   DOB: Mar 16, 1920, 80 y.o.   MRN: [id number]    No Known Allergies  Chief Complaint  Patient presents with  . Annual Exam    Yearly check-up and discuss labs completed on 05/08/13     HPI: Patient is a 80 y.o. female seen in the office today for yearly visit; reports she is having problems with her esophagus and food going down; Went to GI doctor (dr Wynetta Emery), who recommended her to chew up food more and wanted to send her to a doctor in [place] to stretch her esophagus  She has not been able to eat, pills get sucks Lives with son; cooks her own food, does her own thing; has been doing well; no complaints other than not being able to swallow good  Review of Systems:  Review of Systems  Constitutional: Negative for fever, chills and malaise/fatigue.  HENT: Positive for congestion (chronic). Negative for sore throat and tinnitus.   Eyes: Negative for blurred vision.       Does not go to eye doctor  Respiratory: Positive for cough and shortness of breath (chronic shortness of breathd--better).   Cardiovascular: Negative for chest pain and leg swelling.  Gastrointestinal: Positive for heartburn. Negative for diarrhea and constipation.  Genitourinary: Negative for dysuria, urgency and frequency.  Musculoskeletal: Negative for back pain, falls and myalgias.  Skin: Negative.   Neurological: Negative for dizziness, tingling, sensory change, weakness and headaches.  Endo/Heme/Allergies: Positive for environmental allergies.  Psychiatric/Behavioral: Negative for depression. The patient has insomnia (some nights are better than others). The patient is not nervous/anxious.      Past Medical History  Diagnosis Date  . Hypertension   . GERD (gastroesophageal reflux disease)   . Arthritis   . Dysrhythmia     Bradycardia, 1st degree AV block  . Dyspepsia and other specified disorders of function of stomach   . Other dyspnea and respiratory abnormality    . Other malaise and fatigue   . Osteoporosis, unspecified   . Unspecified prolapse of vaginal walls   . Edema   . Alzheimer's disease   . Other and unspecified hyperlipidemia   . Unspecified vitamin D deficiency   . Osteoarthrosis, unspecified whether generalized or localized, unspecified site   . Anemia, unspecified   . Anxiety state, unspecified   . Restless legs syndrome (RLS)   . Internal hemorrhoids without mention of complication   . Dysphagia, unspecified(787.20)   . Urinary frequency   . Nervousness(799.21)    Past Surgical History  Procedure Laterality Date  . Abdominal hysterectomy  2011  . Eye surgery      catarct extraction bilateral  . Esophagogastroduodenoscopy (egd) with propofol  05/17/2012    Procedure: ESOPHAGOGASTRODUODENOSCOPY (EGD) WITH PROPOFOL;  Surgeon: Garlan Fair, MD;  Location: [facility];  Service: Endoscopy;  Laterality: N/A;  . Thyroid surgery  1954  . Appendectomy  1972   Social History:   reports that she has never smoked. She has never used smokeless tobacco. She reports that she does not drink alcohol or use illicit drugs.  History reviewed. No pertinent family history.  Medications: Patient's Medications  New Prescriptions   No medications on file  Previous Medications   ALPRAZOLAM (XANAX) 0.25 MG TABLET    Take 0.5 tablets (0.125 mg total) by mouth at bedtime as needed for sleep.   CITALOPRAM (CELEXA) 20 MG TABLET    Take 1 tablet (20 mg total) by mouth daily.  HYDRALAZINE (APRESOLINE) 25 MG TABLET    Take one tablet by mouth three times daily   PANTOPRAZOLE (PROTONIX) 40 MG TABLET    Take 1 tablet (40 mg total) by mouth daily.  Modified Medications   No medications on file  Discontinued Medications   ALISKIREN (TEKTURNA) 150 MG TABLET    Take 150 mg by mouth every morning.     Physical Exam:  Filed Vitals:   05/25/13 1107  BP: 118/66  Pulse: 55  Resp: 10  Height: 5' 5.5" (1.664 m)  Weight: 180 lb 12.8 oz (82.01 kg)    SpO2: 99%    Physical Exam  Constitutional: She is oriented to person, place, and time and well-developed, well-nourished, and in no distress. No distress.  HENT:  Head: Normocephalic and atraumatic.  Right Ear: External ear normal.  Left Ear: External ear normal.  Nose: Nose normal.  Mouth/Throat: Oropharynx is clear and moist. No oropharyngeal exudate.  Eyes: Conjunctivae and EOM are normal. Pupils are equal, round, and reactive to light.  Neck: Normal range of motion. Neck supple.  Cardiovascular: Normal rate, regular rhythm and normal heart sounds.   Pulmonary/Chest: Effort normal and breath sounds normal. No respiratory distress.  Abdominal: Soft. Bowel sounds are normal. She exhibits no distension.  Genitourinary:  Declined exam  Musculoskeletal: Normal range of motion. She exhibits no edema and no tenderness.  Neurological: She is alert and oriented to person, place, and time. Gait normal.  Skin: Skin is warm and dry. She is not diaphoretic. No erythema.  Psychiatric: Affect normal.  MMSE 21/30     Labs reviewed: Basic Metabolic Panel:  Recent Labs  11/02/12 1655 05/08/13 0807  NA 140 138  K 4.2 4.4  CL 99 100  CO2 25 23  GLUCOSE 78 94  BUN 13 9*  CREATININE 0.79 0.89  CALCIUM 9.4 9.7  TSH 2.040  --    Liver Function Tests:  Recent Labs  11/02/12 1655 05/08/13 0807  AST 18 13  ALT 12 12  ALKPHOS 84 74  BILITOT 0.2 0.3  PROT 6.9 6.5   No results found for this basename: LIPASE, AMYLASE,  in the last 8760 hours No results found for this basename: AMMONIA,  in the last 8760 hours CBC:  Recent Labs  11/02/12 1655 05/08/13 0807  WBC 6.0 4.2  NEUTROABS 4.2 2.7  HGB 11.8 12.2  HCT 36.4 36.4  MCV 94 92  PLT 190 228   Lipid Panel:  Recent Labs  05/08/13 0807  HDL 54  LDLCALC 120*  TRIG 77  CHOLHDL 3.5   TSH:  Recent Labs  11/02/12 1655  TSH 2.040      Assessment/Plan  1. Unspecified hereditary and idiopathic peripheral  neuropathy -new; will start gabapentin to see if this helps with pain - gabapentin (NEURONTIN) 100 MG capsule; 1 tablet three times daily for neuropathy; may increase to 2 tablets three times daily if needed for pain  Dispense: 180 capsule; Refill: 3  2. Anxiety state, unspecified -improved  - citalopram (CELEXA) 20 MG tablet; Take 1 tablet (20 mg total) by mouth daily.  Dispense: 30 tablet; Refill: 3  3. GERD (gastroesophageal reflux disease) -with history of esophageal strictures; previous doctor who did a dilation in the past has retired; GI has recommenced her see someone but she is not ready to go -strongly recommended her to make appt with this doctor since she has already lost weight due to not being able to eat properly  - pantoprazole (  PROTONIX) 40 MG tablet; Take 1 tablet (40 mg total) by mouth daily.  Dispense: 30 tablet; Refill: 3  4. Essential hypertension, benign -Patient is stable; continue current regimen. Will monitor and make changes as necessary. -conts lifestyle modification - hydrALAZINE (APRESOLINE) 25 MG tablet; Take one tablet by mouth three times daily  Dispense: 90 tablet; Refill: 3  5. Osteopenia -from 2012 DEXA does not wish for any treatment -suggested calicum with vit D but does not wish to take this eighter  6. Memory loss -MMSE of 21/30 indicating mild memory impairment -does not feel as this is a problem; does not wish to be on medication -lives with son and he helps her; she is mostly independent   7. PREVENTIVE COUNSELING:   The patient does not wish for any additional preventive care; she is 81; does not want treatment for OP, no mammograms, and she has recently had colonoscopy  -does not wish to take any more medication than she already does

## 2013-05-25 NOTE — Progress Notes (Signed)
Passed clock drawing 

## 2013-05-25 NOTE — Patient Instructions (Addendum)
Try Claritin by mouth daily for cough and sneezing  Will give Rx for gabapentin 100 mg-- may take 3 times daily, can increase to 2 tablets three times daily if needed   I recommend you going to see the doctor in [place]-- you have lost weight due to not being able to eat correctly

## 2013-05-31 ENCOUNTER — Encounter: Payer: Medicare Other | Admitting: Nurse Practitioner

## 2013-08-23 ENCOUNTER — Ambulatory Visit (INDEPENDENT_AMBULATORY_CARE_PROVIDER_SITE_OTHER): Payer: Medicare Other | Admitting: Nurse Practitioner

## 2013-08-23 ENCOUNTER — Encounter: Payer: Self-pay | Admitting: Nurse Practitioner

## 2013-08-23 VITALS — BP 130/72 | HR 52 | Resp 10 | Wt 179.0 lb

## 2013-08-23 DIAGNOSIS — I10 Essential (primary) hypertension: Secondary | ICD-10-CM

## 2013-08-23 DIAGNOSIS — F411 Generalized anxiety disorder: Secondary | ICD-10-CM

## 2013-08-23 DIAGNOSIS — R634 Abnormal weight loss: Secondary | ICD-10-CM

## 2013-08-23 DIAGNOSIS — G609 Hereditary and idiopathic neuropathy, unspecified: Secondary | ICD-10-CM

## 2013-08-23 MED ORDER — ALPRAZOLAM 0.25 MG PO TABS: 0.1250 mg | ORAL_TABLET | Freq: Every evening | ORAL | Status: DC | PRN

## 2013-08-23 MED ORDER — GABAPENTIN 300 MG PO CAPS: 300.0000 mg | ORAL_CAPSULE | Freq: Three times a day (TID) | ORAL | Status: DC

## 2013-08-23 NOTE — Patient Instructions (Signed)
Will increase gabapentin (neurontin) to 300 mg three times a daily ---you can take 3 of the 100 mg tablets three times a day to equal 300 mg

## 2013-08-23 NOTE — Progress Notes (Signed)
Patient ID: Cynthia Shepard, female   DOB: Aug 02, 1919, 80 y.o.   MRN: [id number]    No Known Allergies  Chief Complaint  Patient presents with  . Medical Management of Chronic Issues    3 month follow-up, no recent labs  . Foot Burn    Bottom of feet burning, mainly at night (ongoing concern)   . Ankle Problem    Ankles feel like they are iced x several months (off/on)     HPI: Patient is a 80 y.o.  female seen in the office today for routine follow up;  pt reports she no longer has anxiety since on medication. However still has trouble sleeping at times so would like refill on xanax for sleep  Going to [place] to see GI doctor next month due to swallowing difficulties, had gotten worse but now she is able to eat some foods, soft foods and soup. Can not eat meat.  Started taking gabapentin after last visit and reports it has helped with the burning. Helps a lot but sometimes still feels feet burning. Can not sleep without medication  Review of Systems:  Review of Systems  Constitutional: Negative for malaise/fatigue.  Respiratory: Negative for cough and shortness of breath (with exertion).   Cardiovascular: Negative for chest pain and palpitations.  Gastrointestinal: Positive for heartburn. Negative for abdominal pain, diarrhea and constipation.       Increase gas and trouble swallowing   Genitourinary: Negative for dysuria.  Skin: Negative.   Neurological: Positive for tingling and sensory change. Negative for weakness and headaches.  Psychiatric/Behavioral: Negative for depression. The patient has insomnia. The patient is not nervous/anxious.      Past Medical History  Diagnosis Date  . Hypertension   . GERD (gastroesophageal reflux disease)   . Arthritis   . Dysrhythmia     Bradycardia, 1st degree AV block  . Dyspepsia and other specified disorders of function of stomach   . Other dyspnea and respiratory abnormality   . Other malaise and fatigue   .  Osteoporosis, unspecified   . Unspecified prolapse of vaginal walls   . Edema   . Alzheimer's disease   . Other and unspecified hyperlipidemia   . Unspecified vitamin D deficiency   . Osteoarthrosis, unspecified whether generalized or localized, unspecified site   . Anemia, unspecified   . Anxiety state, unspecified   . Restless legs syndrome (RLS)   . Internal hemorrhoids without mention of complication   . Dysphagia, unspecified(787.20)   . Urinary frequency   . Nervousness(799.21)    Past Surgical History  Procedure Laterality Date  . Abdominal hysterectomy  2011  . Eye surgery      catarct extraction bilateral  . Esophagogastroduodenoscopy (egd) with propofol  05/17/2012    Procedure: ESOPHAGOGASTRODUODENOSCOPY (EGD) WITH PROPOFOL;  Surgeon: Garlan Fair, MD;  Location: [facility];  Service: Endoscopy;  Laterality: N/A;  . Thyroid surgery  1954  . Appendectomy  1972   Social History:   reports that she has never smoked. She has never used smokeless tobacco. She reports that she does not drink alcohol or use illicit drugs.  History reviewed. No pertinent family history.  Medications: Patient's Medications  New Prescriptions   No medications on file  Previous Medications   ALPRAZOLAM (XANAX) 0.25 MG TABLET    Take 0.5 tablets (0.125 mg total) by mouth at bedtime as needed for sleep.   CITALOPRAM (CELEXA) 20 MG TABLET    Take 1 tablet (20 mg total)  by mouth daily.   GABAPENTIN (NEURONTIN) 100 MG CAPSULE    1 tablet three times daily for neuropathy; may increase to 2 tablets three times daily if needed for pain   HYDRALAZINE (APRESOLINE) 25 MG TABLET    Take one tablet by mouth three times daily   NYSTATIN (MYCOSTATIN/NYSTOP) 100000 UNIT/GM POWD    Apple to affected area daily   PANTOPRAZOLE (PROTONIX) 40 MG TABLET    Take 1 tablet (40 mg total) by mouth daily.  Modified Medications   No medications on file  Discontinued Medications   No medications on file      Physical Exam:  Filed Vitals:   08/23/13 1359  BP: 130/72  Pulse: 52  Resp: 10  Weight: 179 lb (81.194 kg)  SpO2: 97%    Physical Exam  Constitutional: She is oriented to person, place, and time and well-developed, well-nourished, and in no distress. No distress.  HENT:  Head: Normocephalic and atraumatic.  Neck: Normal range of motion. Neck supple.  Cardiovascular: Normal rate, regular rhythm and normal heart sounds.   Pulmonary/Chest: Effort normal and breath sounds normal. No respiratory distress.  Abdominal: Soft. Bowel sounds are normal.  Musculoskeletal: Normal range of motion. She exhibits no edema and no tenderness.  Neurological: She is alert and oriented to person, place, and time. Gait normal.  Skin: Skin is warm and dry. She is not diaphoretic. No erythema.  Psychiatric: Affect normal.     Labs reviewed: Basic Metabolic Panel:  Recent Labs  11/02/12 1655 05/08/13 0807  NA 140 138  K 4.2 4.4  CL 99 100  CO2 25 23  GLUCOSE 78 94  BUN 13 9*  CREATININE 0.79 0.89  CALCIUM 9.4 9.7  TSH 2.040  --    Liver Function Tests:  Recent Labs  11/02/12 1655 05/08/13 0807  AST 18 13  ALT 12 12  ALKPHOS 84 74  BILITOT 0.2 0.3  PROT 6.9 6.5   No results found for this basename: LIPASE, AMYLASE,  in the last 8760 hours No results found for this basename: AMMONIA,  in the last 8760 hours CBC:  Recent Labs  11/02/12 1655 05/08/13 0807  WBC 6.0 4.2  NEUTROABS 4.2 2.7  HGB 11.8 12.2  HCT 36.4 36.4  MCV 94 92  PLT 190 228   Lipid Panel:  Recent Labs  05/08/13 0807  HDL 54  LDLCALC 120*  TRIG 77  CHOLHDL 3.5   TSH:  Recent Labs  11/02/12 1655  TSH 2.040   A1C: No components found with this basename: A1C,    Assessment/Plan 1. Unspecified hereditary and idiopathic peripheral neuropathy -improved but still with symptoms, will increase for better results - gabapentin (NEURONTIN) 300 MG capsule; Take 1 capsule (300 mg total) by  mouth 3 (three) times daily.  Dispense: 90 capsule; Refill: 3  2. Anxiety state, unspecified -has improved since on celexa, still getting anxiety and insomnia at night, xanax helps - ALPRAZolam (XANAX) 0.25 MG tablet; Take 0.5 tablets (0.125 mg total) by mouth at bedtime as needed for sleep.  Dispense: 30 tablet; Refill: 0  3. Essential hypertension, benign -stable on hydralazine  4. Loss of weight -due to inability to swallow, has gotten some better, has appt scheduled at [place] with GI for possible esophageal dilation  - CBC With differential/Platelet to make sure she is not becoming anemic due to decreased meat intake - Comprehensive metabolic panel to evaluation nutritional status and electrolytes

## 2013-08-24 ENCOUNTER — Encounter: Payer: Self-pay | Admitting: *Deleted

## 2013-08-24 LAB — CBC WITH DIFFERENTIAL
Basophils Absolute: 0 10*3/uL (ref 0.0–0.2)
Basos: 0 %
EOS ABS: 0 10*3/uL (ref 0.0–0.4)
Eos: 1 %
HEMATOCRIT: 34.8 % (ref 34.0–46.6)
HEMOGLOBIN: 11.5 g/dL (ref 11.1–15.9)
IMMATURE GRANS (ABS): 0 10*3/uL (ref 0.0–0.1)
Immature Granulocytes: 0 %
LYMPHS: 21 %
Lymphocytes Absolute: 1.2 10*3/uL (ref 0.7–3.1)
MCH: 31.6 pg (ref 26.6–33.0)
MCHC: 33 g/dL (ref 31.5–35.7)
MCV: 96 fL (ref 79–97)
MONOCYTES: 9 %
Monocytes Absolute: 0.5 10*3/uL (ref 0.1–0.9)
NEUTROS ABS: 3.9 10*3/uL (ref 1.4–7.0)
NEUTROS PCT: 69 %
Platelets: 207 10*3/uL (ref 150–379)
RBC: 3.64 x10E6/uL — ABNORMAL LOW (ref 3.77–5.28)
RDW: 13.2 % (ref 12.3–15.4)
WBC: 5.7 10*3/uL (ref 3.4–10.8)

## 2013-08-24 LAB — COMPREHENSIVE METABOLIC PANEL
ALBUMIN: 4.2 g/dL (ref 3.2–4.6)
ALT: 11 IU/L (ref 0–32)
AST: 20 IU/L (ref 0–40)
Albumin/Globulin Ratio: 1.8 (ref 1.1–2.5)
Alkaline Phosphatase: 79 IU/L (ref 39–117)
BUN/Creatinine Ratio: 15 (ref 11–26)
BUN: 12 mg/dL (ref 10–36)
CALCIUM: 9.5 mg/dL (ref 8.7–10.3)
CO2: 27 mmol/L (ref 18–29)
Chloride: 98 mmol/L (ref 97–108)
Creatinine, Ser: 0.8 mg/dL (ref 0.57–1.00)
GFR calc Af Amer: 75 mL/min/{1.73_m2} (ref >59)
GFR calc non Af Amer: 65 mL/min/{1.73_m2} (ref >59)
GLUCOSE: 87 mg/dL (ref 65–99)
Globulin, Total: 2.4 g/dL (ref 1.5–4.5)
POTASSIUM: 4.4 mmol/L (ref 3.5–5.2)
Sodium: 137 mmol/L (ref 134–144)
TOTAL PROTEIN: 6.6 g/dL (ref 6.0–8.5)
Total Bilirubin: 0.3 mg/dL (ref 0.0–1.2)

## 2013-10-03 ENCOUNTER — Other Ambulatory Visit: Payer: Self-pay | Admitting: Nurse Practitioner

## 2013-11-22 ENCOUNTER — Ambulatory Visit: Payer: Medicare Other | Admitting: Nurse Practitioner

## 2013-11-23 ENCOUNTER — Ambulatory Visit (INDEPENDENT_AMBULATORY_CARE_PROVIDER_SITE_OTHER): Payer: Medicare Other | Admitting: Nurse Practitioner

## 2013-11-23 VITALS — BP 110/70 | HR 55 | Temp 98.3°F | Resp 18 | Ht 65.5 in | Wt 175.0 lb

## 2013-11-23 DIAGNOSIS — R634 Abnormal weight loss: Secondary | ICD-10-CM

## 2013-11-23 DIAGNOSIS — K222 Esophageal obstruction: Secondary | ICD-10-CM

## 2013-11-23 DIAGNOSIS — N898 Other specified noninflammatory disorders of vagina: Secondary | ICD-10-CM

## 2013-11-23 DIAGNOSIS — N9489 Other specified conditions associated with female genital organs and menstrual cycle: Secondary | ICD-10-CM

## 2013-11-23 DIAGNOSIS — G609 Hereditary and idiopathic neuropathy, unspecified: Secondary | ICD-10-CM

## 2013-11-23 DIAGNOSIS — K219 Gastro-esophageal reflux disease without esophagitis: Secondary | ICD-10-CM

## 2013-11-23 DIAGNOSIS — F411 Generalized anxiety disorder: Secondary | ICD-10-CM

## 2013-11-23 DIAGNOSIS — I10 Essential (primary) hypertension: Secondary | ICD-10-CM

## 2013-11-23 MED ORDER — ESTROGENS, CONJUGATED 0.625 MG/GM VA CREA: TOPICAL_CREAM | VAGINAL | Status: DC

## 2013-11-23 MED ORDER — GABAPENTIN 300 MG PO CAPS: 300.0000 mg | ORAL_CAPSULE | Freq: Three times a day (TID) | ORAL | Status: DC

## 2013-11-23 MED ORDER — CITALOPRAM HYDROBROMIDE 20 MG PO TABS: 20.0000 mg | ORAL_TABLET | Freq: Every day | ORAL | Status: DC

## 2013-11-23 MED ORDER — ALPRAZOLAM 0.25 MG PO TABS: 0.1250 mg | ORAL_TABLET | Freq: Every evening | ORAL | Status: DC | PRN

## 2013-11-23 MED ORDER — HYDRALAZINE HCL 25 MG PO TABS: ORAL_TABLET | ORAL | Status: DC

## 2013-11-23 MED ORDER — PANTOPRAZOLE SODIUM 40 MG PO TBEC: DELAYED_RELEASE_TABLET | ORAL | Status: DC

## 2013-11-23 NOTE — Patient Instructions (Signed)
Increase your intake- 3 meals a day with snacks between meals  Follow up in 3 months  Will start vaginal cream to help with dryness- this has been sent to your pharmacy- to take twice weekly

## 2013-11-23 NOTE — Progress Notes (Signed)
Patient ID: Cynthia Shepard, female   DOB: 08/17/1919, 80 y.o.   MRN: [id number]    No Known Allergies  Chief Complaint  Patient presents with  . Follow-up    HPI: Patient is a 80 y.o.  female seen in the office today for routine follow up;  Anxiety remains stable Still having trouble sleeping at night- needs refills   Swallowing is better, following with GI doctor in [place] where she underwent dilatation of her esophagus   Increase in gabapentin helped her neuropathy  protonix helping acid reflux, she is out of these now and GERD is worse  Has lost more weight but reports she is eating better- drinking boost  Reports vaginal itching, on-going with clear discharge   Review of Systems:  Review of Systems  Constitutional: Negative for malaise/fatigue.  Respiratory: Positive for shortness of breath (with exertion). Negative for cough.   Cardiovascular: Negative for chest pain and palpitations.  Gastrointestinal: Positive for heartburn. Negative for abdominal pain, diarrhea and constipation.       Food not sticking, chewing everything up   Genitourinary: Negative for dysuria, urgency and frequency.  Musculoskeletal: Positive for joint pain (hip pain, aspercream helps ). Negative for myalgias.  Skin: Negative.   Neurological: Positive for tingling and sensory change. Negative for weakness and headaches.       Numbness and tingling improved on gabapentin   Psychiatric/Behavioral: Negative for depression. The patient has insomnia. The patient is not nervous/anxious.      Past Medical History  Diagnosis Date  . Hypertension   . GERD (gastroesophageal reflux disease)   . Arthritis   . Dysrhythmia     Bradycardia, 1st degree AV block  . Dyspepsia and other specified disorders of function of stomach   . Other dyspnea and respiratory abnormality   . Other malaise and fatigue   . Osteoporosis, unspecified   . Unspecified prolapse of vaginal walls   . Edema   .  Alzheimer's disease   . Other and unspecified hyperlipidemia   . Unspecified vitamin D deficiency   . Osteoarthrosis, unspecified whether generalized or localized, unspecified site   . Anemia, unspecified   . Anxiety state, unspecified   . Restless legs syndrome (RLS)   . Internal hemorrhoids without mention of complication   . Dysphagia, unspecified(787.20)   . Urinary frequency   . Nervousness(799.21)    Past Surgical History  Procedure Laterality Date  . Abdominal hysterectomy  2011  . Eye surgery      catarct extraction bilateral  . Esophagogastroduodenoscopy (egd) with propofol  05/17/2012    Procedure: ESOPHAGOGASTRODUODENOSCOPY (EGD) WITH PROPOFOL;  Surgeon: Garlan Fair, MD;  Location: [facility];  Service: Endoscopy;  Laterality: N/A;  . Thyroid surgery  1954  . Appendectomy  1972   Social History:   reports that she has never smoked. She has never used smokeless tobacco. She reports that she does not drink alcohol or use illicit drugs.  No family history on file.  Medications: Patient's Medications  New Prescriptions   No medications on file  Previous Medications   ALPRAZOLAM (XANAX) 0.25 MG TABLET    Take 0.5 tablets (0.125 mg total) by mouth at bedtime as needed for sleep.   CITALOPRAM (CELEXA) 20 MG TABLET    Take 1 tablet (20 mg total) by mouth daily.   GABAPENTIN (NEURONTIN) 300 MG CAPSULE    Take 1 capsule (300 mg total) by mouth 3 (three) times daily.   HYDRALAZINE (APRESOLINE) 25 MG  TABLET    Take one tablet by mouth three times daily   NYSTATIN (MYCOSTATIN/NYSTOP) 100000 UNIT/GM POWD    Apple to affected area daily   PANTOPRAZOLE (PROTONIX) 40 MG TABLET    TAKE 1 TABLET BY MOUTH DAILY  Modified Medications   No medications on file  Discontinued Medications   No medications on file     Physical Exam:  Filed Vitals:   11/23/13 1313  BP: 110/70  Pulse: 55  Temp: 98.3 F (36.8 C)  TempSrc: Oral  Resp: 18  Height: 5' 5.5" (1.664 m)  Weight:  175 lb (79.379 kg)  SpO2: 97%    Physical Exam  Constitutional: She is oriented to person, place, and time and well-developed, well-nourished, and in no distress. No distress.  HENT:  Head: Normocephalic and atraumatic.  Neck: Normal range of motion. Neck supple.  Cardiovascular: Normal rate, regular rhythm and normal heart sounds.   Pulmonary/Chest: Effort normal and breath sounds normal. No respiratory distress.  Abdominal: Soft. Bowel sounds are normal.  Genitourinary: Vagina exhibits normal mucosa, no exudate, no lesion and no rugosity. No vaginal discharge found.  Atrophic vaginal area   Musculoskeletal: Normal range of motion. She exhibits no edema and no tenderness.  Neurological: She is alert and oriented to person, place, and time. Gait normal.  Skin: Skin is warm and dry. She is not diaphoretic. No erythema.  Psychiatric: Affect normal.     Labs reviewed: Basic Metabolic Panel:  Recent Labs  05/08/13 0807 08/23/13 1453  NA 138 137  K 4.4 4.4  CL 100 98  CO2 23 27  GLUCOSE 94 87  BUN 9* 12  CREATININE 0.89 0.80  CALCIUM 9.7 9.5   Liver Function Tests:  Recent Labs  05/08/13 0807 08/23/13 1453  AST 13 20  ALT 12 11  ALKPHOS 74 79  BILITOT 0.3 0.3  PROT 6.5 6.6   No results found for this basename: LIPASE, AMYLASE,  in the last 8760 hours No results found for this basename: AMMONIA,  in the last 8760 hours CBC:  Recent Labs  05/08/13 0807 08/23/13 1453  WBC 4.2 5.7  NEUTROABS 2.7 3.9  HGB 12.2 11.5  HCT 36.4 34.8  MCV 92 96  PLT 228 207   Lipid Panel:  Recent Labs  05/08/13 0807  HDL 54  LDLCALC 120*  TRIG 77  CHOLHDL 3.5     Assessment/Plan 1. Anxiety state, unspecified -controlled on current medications  - citalopram (CELEXA) 20 MG tablet; Take 1 tablet (20 mg total) by mouth daily.  Dispense: 90 tablet; Refill: 3 - ALPRAZolam (XANAX) 0.25 MG tablet; Take 0.5 tablets (0.125 mg total) by mouth at bedtime as needed for sleep.   Dispense: 30 tablet; Refill: 0  2. Essential hypertension, benign -currently controlled on hydralazine  - hydrALAZINE (APRESOLINE) 25 MG tablet; Take one tablet by mouth three times daily  Dispense: 180 tablet; Refill: 3  3. Unspecified hereditary and idiopathic peripheral neuropathy -will cont gabapentin  - gabapentin (NEURONTIN) 300 MG capsule; Take 1 capsule (300 mg total) by mouth 3 (three) times daily.  Dispense: 190 capsule; Refill: 3  4. Gastroesophageal reflux disease without esophagitis -improved on protonix, refill provided  - pantoprazole (PROTONIX) 40 MG tablet; TAKE 1 TABLET BY MOUTH DAILY  Dispense: 90 tablet; Refill: 3  5. Loss of weight -increase intake -cont to take boost but also eat 3   6. Esophageal stricture -has had esophagus dilated by GI physician, reports food is going down easier  and not getting stuck -cont to take small bites  7. Vaginal dryness - conjugated estrogens (PREMARIN) vaginal cream; 1 application Premarin; 0.240 mg conjugated estrogens/1 g cream vaginally twice weekly for vaginal dryness  Dispense: 42.5 g; Refill: 12   Follow up in 3 months

## 2014-02-11 ENCOUNTER — Emergency Department (HOSPITAL_BASED_OUTPATIENT_CLINIC_OR_DEPARTMENT_OTHER)
Admission: EM | Admit: 2014-02-11 | Discharge: 2014-02-12 | Disposition: A | Discharge status: Other Acute Inpatient Hospital | Payer: Medicare Other | Attending: Emergency Medicine | Admitting: Emergency Medicine

## 2014-02-11 ENCOUNTER — Other Ambulatory Visit: Payer: Self-pay

## 2014-02-11 ENCOUNTER — Encounter (HOSPITAL_BASED_OUTPATIENT_CLINIC_OR_DEPARTMENT_OTHER): Payer: Self-pay | Admitting: Emergency Medicine

## 2014-02-11 DIAGNOSIS — Z79899 Other long term (current) drug therapy: Secondary | ICD-10-CM | POA: Diagnosis not present

## 2014-02-11 DIAGNOSIS — Z90710 Acquired absence of both cervix and uterus: Secondary | ICD-10-CM | POA: Diagnosis not present

## 2014-02-11 DIAGNOSIS — R109 Unspecified abdominal pain: Secondary | ICD-10-CM | POA: Diagnosis present

## 2014-02-11 DIAGNOSIS — F411 Generalized anxiety disorder: Secondary | ICD-10-CM | POA: Diagnosis not present

## 2014-02-11 DIAGNOSIS — I10 Essential (primary) hypertension: Secondary | ICD-10-CM | POA: Diagnosis not present

## 2014-02-11 DIAGNOSIS — K222 Esophageal obstruction: Secondary | ICD-10-CM | POA: Diagnosis not present

## 2014-02-11 DIAGNOSIS — Z862 Personal history of diseases of the blood and blood-forming organs and certain disorders involving the immune mechanism: Secondary | ICD-10-CM | POA: Insufficient documentation

## 2014-02-11 DIAGNOSIS — Z9089 Acquired absence of other organs: Secondary | ICD-10-CM | POA: Insufficient documentation

## 2014-02-11 DIAGNOSIS — G309 Alzheimer's disease, unspecified: Secondary | ICD-10-CM | POA: Insufficient documentation

## 2014-02-11 DIAGNOSIS — T18128D Food in esophagus causing other injury, subsequent encounter: Secondary | ICD-10-CM

## 2014-02-11 MED ORDER — FENTANYL CITRATE 0.05 MG/ML IJ SOLN
50.0000 ug | Freq: Once | INTRAMUSCULAR | Status: AC
  Administered 2014-02-12: 50 ug via INTRAVENOUS
  Filled 2014-02-11: qty 2

## 2014-02-11 MED ORDER — GLUCAGON HCL RDNA (DIAGNOSTIC) 1 MG IJ SOLR
INTRAMUSCULAR | Status: AC
  Filled 2014-02-11: qty 1

## 2014-02-11 MED ORDER — ONDANSETRON HCL 4 MG/2ML IJ SOLN
4.0000 mg | Freq: Once | INTRAMUSCULAR | Status: AC
  Administered 2014-02-12: 4 mg via INTRAVENOUS
  Filled 2014-02-11: qty 2

## 2014-02-11 MED ORDER — GLUCAGON HCL RDNA (DIAGNOSTIC) 1 MG IJ SOLR
0.5000 mg | Freq: Once | INTRAMUSCULAR | Status: AC
  Administered 2014-02-11: 0.5 mg via INTRAVENOUS

## 2014-02-11 NOTE — ED Provider Notes (Signed)
CSN: [id number]     Arrival date & time 02/11/14  2134 History   Chief Complaint  Patient presents with  . Abdominal Pain   The history is provided by the patient and a relative. No language interpreter was used.    HPI Comments: Cynthia Shepard is a 80 y.o. female who presents to the Emergency Department complaining of vomiting and pain in epigastrium.  Pt has h/o stricture at LES, has had multiple dilations.  She had been seen by Dr Wynetta Emery with [facility], but has been referred to [facility], seen by them in May for dilation and injection of botox.  Pt ate lunch around 2 pm at [employer] today, had chicken fried steak.  Since that time has been unable to swallow and keep things down.  She is unable to handle her own secretions.  She is c/o shortness of breath due to epigastric pain.   Past Medical History  Diagnosis Date  . Hypertension   . GERD (gastroesophageal reflux disease)   . Arthritis   . Dysrhythmia     Bradycardia, 1st degree AV block  . Dyspepsia and other specified disorders of function of stomach   . Other dyspnea and respiratory abnormality   . Other malaise and fatigue   . Osteoporosis, unspecified   . Unspecified prolapse of vaginal walls   . Edema   . Alzheimer's disease   . Other and unspecified hyperlipidemia   . Unspecified vitamin D deficiency   . Osteoarthrosis, unspecified whether generalized or localized, unspecified site   . Anemia, unspecified   . Anxiety state, unspecified   . Restless legs syndrome (RLS)   . Internal hemorrhoids without mention of complication   . Dysphagia, unspecified(787.20)   . Urinary frequency   . Nervousness(799.21)    Past Surgical History  Procedure Laterality Date  . Abdominal hysterectomy  2011  . Eye surgery      catarct extraction bilateral  . Esophagogastroduodenoscopy (egd) with propofol  05/17/2012    Procedure: ESOPHAGOGASTRODUODENOSCOPY (EGD) WITH PROPOFOL;  Surgeon: Garlan Fair, MD;  Location: [facility];   Service: Endoscopy;  Laterality: N/A;  . Thyroid surgery  1954  . Appendectomy  1972   No family history on file. History  Substance Use Topics  . Smoking status: Never Smoker   . Smokeless tobacco: Never Used  . Alcohol Use: No   OB History   Grav Para Term Preterm Abortions TAB SAB Ect Mult Living                 Review of Systems  Gastrointestinal: Positive for abdominal pain.  All other systems reviewed and are negative.   A complete 10 system review of systems was obtained and all systems are negative except as noted in the HPI and PMH.   Allergies  Review of patient's allergies indicates no known allergies.  Home Medications   Prior to Admission medications   Medication Sig Start Date End Date Taking? Authorizing Provider  ALPRAZolam (XANAX) 0.25 MG tablet Take 0.5 tablets (0.125 mg total) by mouth at bedtime as needed for sleep. 11/23/13   Lauree Chandler, NP  citalopram (CELEXA) 20 MG tablet Take 1 tablet (20 mg total) by mouth daily. 11/23/13   Lauree Chandler, NP  conjugated estrogens (PREMARIN) vaginal cream 1 application Premarin; 3.976 mg conjugated estrogens/1 g cream vaginally twice weekly for vaginal dryness 11/23/13   Lauree Chandler, NP  gabapentin (NEURONTIN) 300 MG capsule Take 1 capsule (300 mg  total) by mouth 3 (three) times daily. 11/23/13   Lauree Chandler, NP  hydrALAZINE (APRESOLINE) 25 MG tablet Take one tablet by mouth three times daily 11/23/13   Lauree Chandler, NP  nystatin (MYCOSTATIN/NYSTOP) 100000 UNIT/GM POWD Apple to affected area daily 05/25/13   Lauree Chandler, NP  pantoprazole (PROTONIX) 40 MG tablet TAKE 1 TABLET BY MOUTH DAILY 11/23/13   Lauree Chandler, NP   BP 119/102  Pulse 73  Temp(Src) 97.6 F (36.4 C) (Oral)  Resp 24  Ht 5\' 9"  (1.753 m)  Wt 175 lb (79.379 kg)  BMI 25.83 kg/m2  SpO2 97% Physical Exam  Constitutional: She is oriented to person, place, and time. She appears well-developed and well-nourished.  Frail  elderly female uncomfortable appearing  HENT:  Head: Normocephalic and atraumatic.  Nose: Nose normal.  Mouth/Throat: Oropharynx is clear and moist.  Eyes: Conjunctivae and EOM are normal. Pupils are equal, round, and reactive to light.  Neck: Normal range of motion. Neck supple. No JVD present. No tracheal deviation present. No thyromegaly present.  Cardiovascular: Normal rate, normal heart sounds and intact distal pulses.  Exam reveals no gallop and no friction rub.   No murmur heard. Irregular rhythm  Pulmonary/Chest: Effort normal and breath sounds normal. No stridor. No respiratory distress. She has no wheezes. She has no rales. She exhibits no tenderness.  Abdominal: Soft. Bowel sounds are normal. She exhibits no distension and no mass. There is tenderness (mild epigatric tenderness). There is no rebound and no guarding.  Musculoskeletal: Normal range of motion. She exhibits no edema and no tenderness.  Lymphadenopathy:    She has no cervical adenopathy.  Neurological: She is alert and oriented to person, place, and time. She displays normal reflexes. She exhibits normal muscle tone. Coordination normal.  Skin: Skin is warm and dry. No rash noted. No erythema. No pallor.  Psychiatric: She has a normal mood and affect. Her behavior is normal. Judgment and thought content normal.    ED Course  Procedures   DIAGNOSTIC STUDIES: Oxygen Saturation is 97% on room air,normal by my interpretation.    COORDINATION OF CARE: 9:46 PM Discussed treatment plan with pt at bedside and pt agreed to plan.   Labs Review Labs Reviewed  CBC WITH DIFFERENTIAL - Abnormal; Notable for the following:    Neutrophils Relative % 84 (*)    All other components within normal limits  BASIC METABOLIC PANEL - Abnormal; Notable for the following:    Glucose, Bld 141 (*)    GFR calc non Af Amer 62 (*)    GFR calc Af Amer 72 (*)    Anion gap 18 (*)    All other components within normal limits    Imaging  Review No results found.   EKG Interpretation None      Date: 02/11/2014  Rate: 74  Rhythm: normal sinus rhythm and premature atrial contractions (PAC)  QRS Axis: normal  Intervals: PR prolonged  ST/T Wave abnormalities: normal  Conduction Disutrbances:first-degree A-V block   Narrative Interpretation:   Old EKG Reviewed: none available  MDM   Final diagnoses:  Food impaction of esophagus, subsequent encounter    80 yo female with food bolus impaction.  Will d/w GI at [facility] for transfer.  D/w Dr Conley Rolls at [facility] ED who accepts patient in transfer to their ED.  Pt has had no relief with coke, glucagon.  Pt with HTN, will give sl ntg as well to see if that assists passage  Kalman Drape, MD 02/12/14 [phone]

## 2014-02-11 NOTE — ED Notes (Signed)
Pt reports had k and w today and had country fried steak and started having stomach pains and pain with breathing.  Family reports patient had surgery in the 1960's because her esophagus muscle stopped working and has been having issues since.  Family reports numerous esophagus stretched numerous times. Pt reports "food just wont go down".

## 2014-02-12 DIAGNOSIS — K222 Esophageal obstruction: Secondary | ICD-10-CM | POA: Diagnosis not present

## 2014-02-12 DIAGNOSIS — Z90710 Acquired absence of both cervix and uterus: Secondary | ICD-10-CM | POA: Diagnosis not present

## 2014-02-12 DIAGNOSIS — R0902 Hypoxemia: Secondary | ICD-10-CM | POA: Insufficient documentation

## 2014-02-12 DIAGNOSIS — J690 Pneumonitis due to inhalation of food and vomit: Secondary | ICD-10-CM | POA: Insufficient documentation

## 2014-02-12 DIAGNOSIS — R109 Unspecified abdominal pain: Secondary | ICD-10-CM | POA: Diagnosis present

## 2014-02-12 DIAGNOSIS — G309 Alzheimer's disease, unspecified: Secondary | ICD-10-CM | POA: Diagnosis not present

## 2014-02-12 DIAGNOSIS — Z9089 Acquired absence of other organs: Secondary | ICD-10-CM | POA: Diagnosis not present

## 2014-02-12 DIAGNOSIS — F411 Generalized anxiety disorder: Secondary | ICD-10-CM | POA: Diagnosis not present

## 2014-02-12 DIAGNOSIS — Z79899 Other long term (current) drug therapy: Secondary | ICD-10-CM | POA: Diagnosis not present

## 2014-02-12 DIAGNOSIS — Z862 Personal history of diseases of the blood and blood-forming organs and certain disorders involving the immune mechanism: Secondary | ICD-10-CM | POA: Diagnosis not present

## 2014-02-12 DIAGNOSIS — I10 Essential (primary) hypertension: Secondary | ICD-10-CM | POA: Diagnosis not present

## 2014-02-12 LAB — CBC WITH DIFFERENTIAL/PLATELET
BASOS PCT: 0 % (ref 0–1)
Basophils Absolute: 0 10*3/uL (ref 0.0–0.1)
Eosinophils Absolute: 0 10*3/uL (ref 0.0–0.7)
Eosinophils Relative: 0 % (ref 0–5)
HEMATOCRIT: 37.1 % (ref 36.0–46.0)
HEMOGLOBIN: 12.4 g/dL (ref 12.0–15.0)
LYMPHS PCT: 12 % (ref 12–46)
Lymphs Abs: 1 10*3/uL (ref 0.7–4.0)
MCH: 31.2 pg (ref 26.0–34.0)
MCHC: 33.4 g/dL (ref 30.0–36.0)
MCV: 93.2 fL (ref 78.0–100.0)
MONO ABS: 0.4 10*3/uL (ref 0.1–1.0)
MONOS PCT: 4 % (ref 3–12)
NEUTROS ABS: 6.8 10*3/uL (ref 1.7–7.7)
NEUTROS PCT: 84 % — AB (ref 43–77)
Platelets: 226 10*3/uL (ref 150–400)
RBC: 3.98 MIL/uL (ref 3.87–5.11)
RDW: 13.1 % (ref 11.5–15.5)
WBC: 8.1 10*3/uL (ref 4.0–10.5)

## 2014-02-12 LAB — BASIC METABOLIC PANEL
Anion gap: 18 — ABNORMAL HIGH (ref 5–15)
BUN: 12 mg/dL (ref 6–23)
CHLORIDE: 100 meq/L (ref 96–112)
CO2: 21 meq/L (ref 19–32)
CREATININE: 0.8 mg/dL (ref 0.50–1.10)
Calcium: 10.1 mg/dL (ref 8.4–10.5)
GFR calc non Af Amer: 62 mL/min — ABNORMAL LOW (ref >90)
GFR, EST AFRICAN AMERICAN: 72 mL/min — AB (ref >90)
Glucose, Bld: 141 mg/dL — ABNORMAL HIGH (ref 70–99)
POTASSIUM: 3.7 meq/L (ref 3.7–5.3)
Sodium: 139 mEq/L (ref 137–147)

## 2014-02-12 MED ORDER — NITROGLYCERIN 0.4 MG SL SUBL: 0.4000 mg | SUBLINGUAL_TABLET | SUBLINGUAL | Status: DC | PRN

## 2014-02-12 MED ORDER — NITROGLYCERIN 0.4 MG SL SUBL
0.4000 mg | SUBLINGUAL_TABLET | SUBLINGUAL | Status: AC | PRN
  Administered 2014-02-12: 0.4 mg via SUBLINGUAL
  Filled 2014-02-12: qty 1

## 2014-02-12 MED FILL — Fentanyl Citrate Inj 0.05 MG/ML: INTRAMUSCULAR | Qty: 2 | Status: AC

## 2014-02-12 MED FILL — Ondansetron HCl Inj 4 MG/2ML (2 MG/ML): INTRAMUSCULAR | Qty: 2 | Status: AC

## 2014-02-15 ENCOUNTER — Encounter: Payer: Self-pay | Admitting: Nurse Practitioner

## 2014-02-15 ENCOUNTER — Ambulatory Visit (INDEPENDENT_AMBULATORY_CARE_PROVIDER_SITE_OTHER): Payer: Medicare Other | Admitting: Nurse Practitioner

## 2014-02-15 VITALS — BP 180/78 | HR 96 | Temp 98.4°F | Resp 18 | Ht 67.0 in | Wt 179.0 lb

## 2014-02-15 DIAGNOSIS — J690 Pneumonitis due to inhalation of food and vomit: Secondary | ICD-10-CM | POA: Diagnosis not present

## 2014-02-15 DIAGNOSIS — Z23 Encounter for immunization: Secondary | ICD-10-CM | POA: Diagnosis not present

## 2014-02-15 DIAGNOSIS — K219 Gastro-esophageal reflux disease without esophagitis: Secondary | ICD-10-CM

## 2014-02-15 MED ORDER — TRIAMTERENE-HCTZ 37.5-25 MG PO TABS: 1.0000 | ORAL_TABLET | Freq: Every morning | ORAL | Status: DC

## 2014-02-15 MED ORDER — PANTOPRAZOLE SODIUM 40 MG PO TBEC: DELAYED_RELEASE_TABLET | ORAL | Status: DC

## 2014-02-15 NOTE — Patient Instructions (Signed)
Will follow up chest xray- will notify you of the result  Take all of antibiotics

## 2014-02-15 NOTE — Progress Notes (Signed)
Patient ID: Cynthia Shepard, female   DOB: 1919-11-08, 80 y.o.   MRN: [id number]    No Known Allergies  Chief Complaint  Patient presents with  . Hospitalization Follow-up    HPI: Patient is a 80 y.o.  female seen in the office today for follow up; over the weekend (5 days ago) she got food stuck in her esophagus. Went to the Emergency room and transferred her to [place]. Had another EGD. Showed impacted esophagus and they cleaned that out with a endoscopy. Then showed ulcerations. Placed her on a liquid diet. Xray showed possible pneumonia. They wanted to keep her over night but she did not wish to do so. Therefore they discharged her on cleocin and she here today to follow up possible pneumonia (requesting follow up chest xray). No fever or chills. No shortness of breath or cough. Still on cleocin at this time.  Review of Systems:  Review of Systems  Constitutional: Negative for malaise/fatigue.  Respiratory: Positive for shortness of breath (with exertion). Negative for cough.   Cardiovascular: Negative for chest pain and palpitations.  Gastrointestinal: Positive for heartburn. Negative for abdominal pain, diarrhea and constipation.       Food not sticking, chewing everything up   Genitourinary: Negative for dysuria, urgency and frequency.  Musculoskeletal: Positive for joint pain (hip pain, aspercream helps ). Negative for myalgias.  Skin: Negative.   Neurological: Positive for tingling and sensory change. Negative for weakness and headaches.       Numbness and tingling improved on gabapentin   Psychiatric/Behavioral: Negative for depression. The patient has insomnia. The patient is not nervous/anxious.      Past Medical History  Diagnosis Date  . Hypertension   . GERD (gastroesophageal reflux disease)   . Arthritis   . Dysrhythmia     Bradycardia, 1st degree AV block  . Dyspepsia and other specified disorders of function of stomach   . Other dyspnea and respiratory  abnormality   . Other malaise and fatigue   . Osteoporosis, unspecified   . Unspecified prolapse of vaginal walls   . Edema   . Alzheimer's disease   . Other and unspecified hyperlipidemia   . Unspecified vitamin D deficiency   . Osteoarthrosis, unspecified whether generalized or localized, unspecified site   . Anemia, unspecified   . Anxiety state, unspecified   . Restless legs syndrome (RLS)   . Internal hemorrhoids without mention of complication   . Dysphagia, unspecified(787.20)   . Urinary frequency   . Nervousness(799.21)    Past Surgical History  Procedure Laterality Date  . Abdominal hysterectomy  2011  . Eye surgery      catarct extraction bilateral  . Esophagogastroduodenoscopy (egd) with propofol  05/17/2012    Procedure: ESOPHAGOGASTRODUODENOSCOPY (EGD) WITH PROPOFOL;  Surgeon: Garlan Fair, MD;  Location: [facility];  Service: Endoscopy;  Laterality: N/A;  . Thyroid surgery  1954  . Appendectomy  1972   Social History:   reports that she has never smoked. She has never used smokeless tobacco. She reports that she does not drink alcohol or use illicit drugs.  No family history on file.  Medications: Patient's Medications  New Prescriptions   No medications on file  Previous Medications   ALPRAZOLAM (XANAX) 0.25 MG TABLET    Take 0.5 tablets (0.125 mg total) by mouth at bedtime as needed for sleep.   CITALOPRAM (CELEXA) 20 MG TABLET    Take 1 tablet (20 mg total) by mouth daily.  CLINDAMYCIN (CLEOCIN) 300 MG CAPSULE    Take 300 mg by mouth 3 (three) times daily.   CONJUGATED ESTROGENS (PREMARIN) VAGINAL CREAM    1 application Premarin; 1.610 mg conjugated estrogens/1 g cream vaginally twice weekly for vaginal dryness   GABAPENTIN (NEURONTIN) 300 MG CAPSULE    Take 1 capsule (300 mg total) by mouth 3 (three) times daily.   HYDRALAZINE (APRESOLINE) 25 MG TABLET    Take one tablet by mouth three times daily   NYSTATIN (MYCOSTATIN/NYSTOP) 100000 UNIT/GM POWD     Apple to affected area daily  Modified Medications   Modified Medication Previous Medication   PANTOPRAZOLE (PROTONIX) 40 MG TABLET pantoprazole (PROTONIX) 40 MG tablet      TAKE 1 TABLET BY MOUTH DAILY    TAKE 1 TABLET BY MOUTH DAILY   TRIAMTERENE-HYDROCHLOROTHIAZIDE (MAXZIDE-25) 37.5-25 MG PER TABLET triamterene-hydrochlorothiazide (MAXZIDE-25) 37.5-25 MG per tablet      Take 1 tablet by mouth every morning.    Take 1 tablet by mouth every morning.  Discontinued Medications   No medications on file     Physical Exam:  Filed Vitals:   02/15/14 1526  BP: 180/78  Pulse: 96  Temp: 98.4 F (36.9 C)  TempSrc: Oral  Resp: 18  Height: 5\' 7"  (1.702 m)  Weight: 179 lb (81.194 kg)  SpO2: 95%    Physical Exam  Constitutional: She is oriented to person, place, and time and well-developed, well-nourished, and in no distress. No distress.  HENT:  Head: Normocephalic and atraumatic.  Neck: Normal range of motion. Neck supple.  Cardiovascular: Normal rate, regular rhythm and normal heart sounds.   Pulmonary/Chest: Effort normal and breath sounds normal. No respiratory distress.  Abdominal: Soft. Bowel sounds are normal.  Genitourinary: Vagina exhibits normal mucosa, no exudate, no lesion and no rugosity.  Musculoskeletal: Normal range of motion. She exhibits no edema and no tenderness.  Neurological: She is alert and oriented to person, place, and time. Gait normal.  Skin: Skin is warm and dry. She is not diaphoretic. No erythema.  Psychiatric: Affect normal.     Labs reviewed: Basic Metabolic Panel:  Recent Labs  05/08/13 0807 08/23/13 1453 02/11/14 2210  NA 138 137 139  K 4.4 4.4 3.7  CL 100 98 100  CO2 23 27 21   GLUCOSE 94 87 141*  BUN 9* 12 12  CREATININE 0.89 0.80 0.80  CALCIUM 9.7 9.5 10.1   Liver Function Tests:  Recent Labs  05/08/13 0807 08/23/13 1453  AST 13 20  ALT 12 11  ALKPHOS 74 79  BILITOT 0.3 0.3  PROT 6.5 6.6   No results found for this  basename: LIPASE, AMYLASE,  in the last 8760 hours No results found for this basename: AMMONIA,  in the last 8760 hours CBC:  Recent Labs  05/08/13 0807 08/23/13 1453 02/11/14 2210  WBC 4.2 5.7 8.1  NEUTROABS 2.7 3.9 6.8  HGB 12.2 11.5 12.4  HCT 36.4 34.8 37.1  MCV 92 96 93.2  PLT 228 207 226   Lipid Panel:  Recent Labs  05/08/13 0807  HDL 54  LDLCALC 120*  TRIG 77  CHOLHDL 3.5     Assessment/Plan  1. Gastroesophageal reflux disease without esophagitis -conts on protonix, refill provide  - pantoprazole (PROTONIX) 40 MG tablet; TAKE 1 TABLET BY MOUTH DAILY  Dispense: 90 tablet; Refill: 3  2. Encounter for immunization -flu shot given  3. Aspiration pneumonia, unspecified aspiration pneumonia type -cont cleocin, will follow up chest xray once  completion of antibiotic  - DG Chest 2 View  Follow up in 3 months

## 2014-02-23 ENCOUNTER — Ambulatory Visit
Admission: RE | Admit: 2014-02-23 | Discharge: 2014-02-23 | Disposition: A | Discharge status: Home / Self Care | Payer: Medicare Other | Source: Ambulatory Visit | Attending: Nurse Practitioner | Admitting: Nurse Practitioner

## 2014-02-26 ENCOUNTER — Telehealth: Payer: Self-pay

## 2014-02-26 NOTE — Telephone Encounter (Signed)
Spoke with Lazarus Gowda (daughter) explain about parenchymal scarring no pneumonia. Daughter understood.

## 2014-03-01 ENCOUNTER — Ambulatory Visit: Payer: Medicare Other | Admitting: Nurse Practitioner

## 2014-04-19 ENCOUNTER — Encounter: Payer: Self-pay | Admitting: Nurse Practitioner

## 2014-04-19 ENCOUNTER — Ambulatory Visit (INDEPENDENT_AMBULATORY_CARE_PROVIDER_SITE_OTHER): Payer: Medicare Other | Admitting: Nurse Practitioner

## 2014-04-19 VITALS — BP 128/72 | HR 55 | Temp 98.6°F | Resp 10 | Ht 65.0 in | Wt 175.0 lb

## 2014-04-19 DIAGNOSIS — R634 Abnormal weight loss: Secondary | ICD-10-CM

## 2014-04-19 DIAGNOSIS — I10 Essential (primary) hypertension: Secondary | ICD-10-CM

## 2014-04-19 DIAGNOSIS — K222 Esophageal obstruction: Secondary | ICD-10-CM

## 2014-04-19 DIAGNOSIS — E785 Hyperlipidemia, unspecified: Secondary | ICD-10-CM

## 2014-04-19 DIAGNOSIS — K219 Gastro-esophageal reflux disease without esophagitis: Secondary | ICD-10-CM

## 2014-04-19 DIAGNOSIS — E559 Vitamin D deficiency, unspecified: Secondary | ICD-10-CM

## 2014-04-19 NOTE — Patient Instructions (Addendum)
Follow blood pressure at home. Write down number Blood pressure should be less than 140/90  Follow up in 4 months with FASTING blood work before next visit

## 2014-04-19 NOTE — Progress Notes (Signed)
Patient ID: SHEMIAH ROSCH, female   DOB: January 01, 1920, 80 y.o.   MRN: [id number]    PCP: Lauree Chandler, NP  No Known Allergies  Chief Complaint  Patient presents with  . Follow-up    Follow-up to check weight and lungs: esophagus has improved, eating a little better.     HPI: Patient is a 80 y.o. female seen in the office today to follow up chronic conditions. Pt with a pmh of HTN, GERD, arthritis, esophageal stricture, vit d def, anxiety Was seen 2 months ago after hospitalization for food being stuck in her throat. Doing much better now. Eating better. Decreased appetite, drinking ensures as supplements.  Reports when she takes blood pressure at home it is always good, does not write it down.   Review of Systems:  Review of Systems  Constitutional: Positive for fatigue (better since she is eating better). Negative for activity change, appetite change and unexpected weight change.  HENT: Negative for congestion and hearing loss.   Eyes: Negative.   Respiratory: Negative for cough and shortness of breath.   Cardiovascular: Negative for chest pain, palpitations and leg swelling.  Gastrointestinal: Negative for abdominal pain, diarrhea and constipation.  Genitourinary: Negative for dysuria and difficulty urinating.  Musculoskeletal: Negative for myalgias and arthralgias.  Skin: Negative for color change and wound.  Neurological: Positive for weakness and numbness (in feet). Negative for dizziness.  Psychiatric/Behavioral: Negative for behavioral problems, confusion and agitation.    Past Medical History  Diagnosis Date  . Hypertension   . GERD (gastroesophageal reflux disease)   . Arthritis   . Dysrhythmia     Bradycardia, 1st degree AV block  . Dyspepsia and other specified disorders of function of stomach   . Other dyspnea and respiratory abnormality   . Other malaise and fatigue   . Osteoporosis, unspecified   . Unspecified prolapse of vaginal walls   . Edema     . Alzheimer's disease   . Other and unspecified hyperlipidemia   . Unspecified vitamin D deficiency   . Osteoarthrosis, unspecified whether generalized or localized, unspecified site   . Anemia, unspecified   . Anxiety state, unspecified   . Restless legs syndrome (RLS)   . Internal hemorrhoids without mention of complication   . Dysphagia, unspecified(787.20)   . Urinary frequency   . Nervousness(799.21)    Past Surgical History  Procedure Laterality Date  . Abdominal hysterectomy  2011  . Eye surgery      catarct extraction bilateral  . Esophagogastroduodenoscopy (egd) with propofol  05/17/2012    Procedure: ESOPHAGOGASTRODUODENOSCOPY (EGD) WITH PROPOFOL;  Surgeon: Garlan Fair, MD;  Location: [facility];  Service: Endoscopy;  Laterality: N/A;  . Thyroid surgery  1954  . Appendectomy  1972   Social History:   reports that she has never smoked. She has never used smokeless tobacco. She reports that she does not drink alcohol or use illicit drugs.  History reviewed. No pertinent family history.  Medications: Patient's Medications  New Prescriptions   No medications on file  Previous Medications   CITALOPRAM (CELEXA) 20 MG TABLET    Take 1 tablet (20 mg total) by mouth daily.   HYDRALAZINE (APRESOLINE) 25 MG TABLET    Take one tablet by mouth three times daily   PANTOPRAZOLE (PROTONIX) 40 MG TABLET    TAKE 1 TABLET BY MOUTH DAILY  Modified Medications   No medications on file  Discontinued Medications   ALPRAZOLAM (XANAX) 0.25 MG TABLET  Take 0.5 tablets (0.125 mg total) by mouth at bedtime as needed for sleep.   CONJUGATED ESTROGENS (PREMARIN) VAGINAL CREAM    1 application Premarin; 6.861 mg conjugated estrogens/1 g cream vaginally twice weekly for vaginal dryness   GABAPENTIN (NEURONTIN) 300 MG CAPSULE    Take 1 capsule (300 mg total) by mouth 3 (three) times daily.   NYSTATIN (MYCOSTATIN/NYSTOP) 100000 UNIT/GM POWD    Apple to affected area daily    TRIAMTERENE-HYDROCHLOROTHIAZIDE (MAXZIDE-25) 37.5-25 MG PER TABLET    Take 1 tablet by mouth every morning.     Physical Exam:  Filed Vitals:   04/19/14 1452  BP: 128/72  Pulse: 55  Temp: 98.6 F (37 C)  TempSrc: Oral  Resp: 10  Height: 5\' 5"  (1.651 m)  Weight: 175 lb (79.379 kg)  SpO2: 99%    Physical Exam  Constitutional: She is oriented to person, place, and time. She appears well-developed and well-nourished. No distress.  HENT:  Head: Normocephalic and atraumatic.  Mouth/Throat: Oropharynx is clear and moist. No oropharyngeal exudate.  Eyes: Conjunctivae are normal. Pupils are equal, round, and reactive to light.  Neck: Normal range of motion. Neck supple.  Cardiovascular: Normal rate, regular rhythm and normal heart sounds.   Pulmonary/Chest: Effort normal and breath sounds normal.  Abdominal: Soft. Bowel sounds are normal.  Musculoskeletal: She exhibits no edema or tenderness.  Neurological: She is alert and oriented to person, place, and time.  Skin: Skin is warm and dry. She is not diaphoretic.  Psychiatric: She has a normal mood and affect.    Labs reviewed: Basic Metabolic Panel:  Recent Labs  05/08/13 0807 08/23/13 1453 02/11/14 2210  NA 138 137 139  K 4.4 4.4 3.7  CL 100 98 100  CO2 23 27 21   GLUCOSE 94 87 141*  BUN 9* 12 12  CREATININE 0.89 0.80 0.80  CALCIUM 9.7 9.5 10.1   Liver Function Tests:  Recent Labs  05/08/13 0807 08/23/13 1453  AST 13 20  ALT 12 11  ALKPHOS 74 79  BILITOT 0.3 0.3  PROT 6.5 6.6   No results for input(s): LIPASE, AMYLASE in the last 8760 hours. No results for input(s): AMMONIA in the last 8760 hours. CBC:  Recent Labs  05/08/13 0807 08/23/13 1453 02/11/14 2210  WBC 4.2 5.7 8.1  NEUTROABS 2.7 3.9 6.8  HGB 12.2 11.5 12.4  HCT 36.4 34.8 37.1  MCV 92 96 93.2  PLT 228 207 226   Lipid Panel:  Recent Labs  05/08/13 0807  HDL 54  LDLCALC 120*  TRIG 77  CHOLHDL 3.5   TSH: No results for input(s):  TSH in the last 8760 hours. A1C: No results found for: HGBA1C   Assessment/Plan  1. Gastroesophageal reflux disease without esophagitis Stable on protonix   2. Esophageal stricture -symptoms improved, conts on protonix   3. Loss of weight -weight is stable, increasing PO intake  4. Essential hypertension, benign - last visit blood pressure elevated, improved today. Pt to take blood pressure at home and notify if become elevated, not taking triameeterene-hctz as on her med list.  - CBC With differential/Platelet; Future  5. Vitamin D deficiency -hx of deficiency, not on supplement do not see recent labs - Vitamin D, 25-hydroxy; Future  6. Hyperlipidemia -not currently on medications, will follow up labs before next visit  - Comprehensive metabolic panel; Future - Lipid panel; Future

## 2014-05-29 ENCOUNTER — Telehealth: Payer: Self-pay | Admitting: *Deleted

## 2014-05-29 NOTE — Telephone Encounter (Signed)
Patient caregiver called and stated that the patient wants the "nerve" pill called in that was prescribed in the past because she has started itching all over and it usually comes from her nerves. Can we call it in? Please Advise.

## 2014-05-29 NOTE — Telephone Encounter (Signed)
She has been taking celexa 20 mg daily and should still be taking this. Call to confirm this, okay to refill if needed

## 2014-05-29 NOTE — Telephone Encounter (Signed)
Notified Hart Robinsons and she will make sure patient it taking medication.

## 2014-05-30 NOTE — Telephone Encounter (Signed)
Stated that patient has a rash now on her hands and arms and these came when you switched her medication from Xanax to the Celexa. Wants to go back on the Xanax. Please Advise.

## 2014-05-30 NOTE — Telephone Encounter (Signed)
She is been on celexa for many months, tolerating well on OV, needs to be seen for any medication changes

## 2014-05-30 NOTE — Telephone Encounter (Signed)
Scheduled an appointment with Sherrie Mustache for tomorrow.

## 2014-05-31 ENCOUNTER — Ambulatory Visit: Payer: Self-pay | Admitting: Nurse Practitioner

## 2014-08-20 ENCOUNTER — Other Ambulatory Visit: Payer: Medicare Other

## 2014-08-23 ENCOUNTER — Ambulatory Visit (INDEPENDENT_AMBULATORY_CARE_PROVIDER_SITE_OTHER): Payer: Medicare Other | Admitting: Nurse Practitioner

## 2014-08-23 ENCOUNTER — Encounter: Payer: Self-pay | Admitting: Nurse Practitioner

## 2014-08-23 VITALS — BP 138/60 | HR 54 | Temp 98.8°F | Ht 65.0 in | Wt 175.0 lb

## 2014-08-23 DIAGNOSIS — F411 Generalized anxiety disorder: Secondary | ICD-10-CM

## 2014-08-23 DIAGNOSIS — I10 Essential (primary) hypertension: Secondary | ICD-10-CM

## 2014-08-23 DIAGNOSIS — K222 Esophageal obstruction: Secondary | ICD-10-CM

## 2014-08-23 DIAGNOSIS — E559 Vitamin D deficiency, unspecified: Secondary | ICD-10-CM

## 2014-08-23 DIAGNOSIS — G4700 Insomnia, unspecified: Secondary | ICD-10-CM

## 2014-08-23 NOTE — Progress Notes (Signed)
Patient ID: Cynthia Shepard, female   DOB: 1920-02-28, 81 y.o.   MRN: [id number]    PCP: Lauree Chandler, NP  No Known Allergies  Chief Complaint  Patient presents with  . Medical Management of Chronic Issues    4 month follow-up, patient stopped B/P medication. No recent labs (not fasting)  . Sleeping Problem    Patient with trouble staying and falling asleep, was on xanax before.   . Form    Application for disability parking placard     HPI: Patient is a 81 y.o. female seen in the office today to follow up chronic conditions. Pt with a pmh of HTN, GERD, arthritis, esophageal stricture, vit d def, anxiety. Pt stopped taking apresoline due to the fact she does feel like she needs it. Was having cough and thought it was related. Blood pressure has been okay since she has been off medication.  Having runny nose and cough-- allergy symptoms, does not medication. Uses vicks vapor rub which helps conts protonix due to esophageal stricture. Still gets stuck occasionally, lifestyle modifications. Would like handicap sticker- hard to walk in from the parking lot, gets short of breath and has not stop.    Review of Systems:  Review of Systems  Constitutional: Negative for activity change, appetite change, fatigue and unexpected weight change.  HENT: Negative for congestion and hearing loss.   Eyes: Negative.   Respiratory: Positive for shortness of breath (with exertion ). Negative for cough.   Cardiovascular: Negative for chest pain, palpitations and leg swelling.  Gastrointestinal: Negative for abdominal pain, diarrhea and constipation.  Genitourinary: Negative for dysuria and difficulty urinating.  Musculoskeletal: Negative for myalgias and arthralgias.  Skin: Negative for color change and wound.  Neurological: Positive for weakness and numbness (in feet). Negative for dizziness.  Psychiatric/Behavioral: Positive for sleep disturbance. Negative for behavioral problems, confusion  and agitation. The patient is nervous/anxious.     Past Medical History  Diagnosis Date  . Hypertension   . GERD (gastroesophageal reflux disease)   . Arthritis   . Dysrhythmia     Bradycardia, 1st degree AV block  . Dyspepsia and other specified disorders of function of stomach   . Other dyspnea and respiratory abnormality   . Other malaise and fatigue   . Osteoporosis, unspecified   . Unspecified prolapse of vaginal walls   . Edema   . Alzheimer's disease   . Other and unspecified hyperlipidemia   . Unspecified vitamin D deficiency   . Osteoarthrosis, unspecified whether generalized or localized, unspecified site   . Anemia, unspecified   . Anxiety state, unspecified   . Restless legs syndrome (RLS)   . Internal hemorrhoids without mention of complication   . Dysphagia, unspecified(787.20)   . Urinary frequency   . Nervousness(799.21)    Past Surgical History  Procedure Laterality Date  . Abdominal hysterectomy  2011  . Eye surgery      catarct extraction bilateral  . Esophagogastroduodenoscopy (egd) with propofol  05/17/2012    Procedure: ESOPHAGOGASTRODUODENOSCOPY (EGD) WITH PROPOFOL;  Surgeon: Garlan Fair, MD;  Location: [facility];  Service: Endoscopy;  Laterality: N/A;  . Thyroid surgery  1954  . Appendectomy  1972   Social History:   reports that she has never smoked. She has never used smokeless tobacco. She reports that she does not drink alcohol or use illicit drugs.  History reviewed. No pertinent family history.  Medications: Patient's Medications  New Prescriptions   No medications on file  Previous Medications   CITALOPRAM (CELEXA) 20 MG TABLET    Take 1 tablet (20 mg total) by mouth daily.   PANTOPRAZOLE (PROTONIX) 40 MG TABLET    TAKE 1 TABLET BY MOUTH DAILY  Modified Medications   No medications on file  Discontinued Medications   HYDRALAZINE (APRESOLINE) 25 MG TABLET    Take one tablet by mouth three times daily     Physical  Exam:  Filed Vitals:   08/23/14 1258  BP: 138/60  Pulse: 54  Temp: 98.8 F (37.1 C)  TempSrc: Oral  Height: 5\' 5"  (1.651 m)  Weight: 175 lb (79.379 kg)  SpO2: 97%    Physical Exam  Constitutional: She is oriented to person, place, and time. She appears well-developed and well-nourished. No distress.  HENT:  Head: Normocephalic and atraumatic.  Mouth/Throat: Oropharynx is clear and moist. No oropharyngeal exudate.  Eyes: Conjunctivae are normal. Pupils are equal, round, and reactive to light.  Neck: Normal range of motion. Neck supple.  Cardiovascular: Normal rate, regular rhythm and normal heart sounds.   Pulmonary/Chest: Effort normal and breath sounds normal.  Abdominal: Soft. Bowel sounds are normal.  Musculoskeletal: She exhibits no edema or tenderness.  Neurological: She is alert and oriented to person, place, and time.  Skin: Skin is warm and dry. She is not diaphoretic.  Psychiatric: She has a normal mood and affect.    Labs reviewed: Basic Metabolic Panel:  Recent Labs  08/23/13 1453 02/11/14 2210  NA 137 139  K 4.4 3.7  CL 98 100  CO2 27 21  GLUCOSE 87 141*  BUN 12 12  CREATININE 0.80 0.80  CALCIUM 9.5 10.1   Liver Function Tests:  Recent Labs  08/23/13 1453  AST 20  ALT 11  ALKPHOS 79  BILITOT 0.3  PROT 6.6   No results for input(s): LIPASE, AMYLASE in the last 8760 hours. No results for input(s): AMMONIA in the last 8760 hours. CBC:  Recent Labs  08/23/13 1453 02/11/14 2210  WBC 5.7 8.1  NEUTROABS 3.9 6.8  HGB 11.5 12.4  HCT 34.8 37.1  MCV 96 93.2  PLT 207 226   Lipid Panel: No results for input(s): CHOL, HDL, LDLCALC, TRIG, CHOLHDL, LDLDIRECT in the last 8760 hours. TSH: No results for input(s): TSH in the last 8760 hours. A1C: No results found for: HGBA1C   Assessment/Plan  1. Anxiety state -improved on celexa  2. Insomnia -reports she wants xanax for sleep, however has not been on this since ~July of 2014, educated  her on that xanax was not ideal for insomnia and side effects involved.  -has tried melatonin 3 mg in the past but does not feel like she achieved good results.  -discussed proper sleep hygiene and to not stay in the bed except for sleep, max activities during the day, no nap.  -may use melatonin 6 mg qhs -other medication that may be of benefit without side effects to consider in the future would be trazodone or Remeron  - Basic metabolic panel  3. Vitamin D deficiency - will follow up Vitamin D, 25-hydroxy  4. Hypertension  -pt decided she did not need hydralazine so she stopped taking it. Blood pressure stable at this time so will take this off medication list. Will cont to monitor.  5.  Esophageal stricture -stable, conts on protonix and lifestyle modifications.  Follow up in 3 months with Dr Mariea Clonts

## 2014-08-23 NOTE — Patient Instructions (Signed)
May increase melatonin to 6 mg at night Increase activity during the day so you are more sleepy at night  Insomnia Insomnia is frequent trouble falling and/or staying asleep. Insomnia can be a long term problem or a short term problem. Both are common. Insomnia can be a short term problem when the wakefulness is related to a certain stress or worry. Long term insomnia is often related to ongoing stress during waking hours and/or poor sleeping habits. Overtime, sleep deprivation itself can make the problem worse. Every little thing feels more severe because you are overtired and your ability to cope is decreased. CAUSES   Stress, anxiety, and depression.  Poor sleeping habits.  Distractions such as TV in the bedroom.  Naps close to bedtime.  Engaging in emotionally charged conversations before bed.  Technical reading before sleep.  Alcohol and other sedatives. They may make the problem worse. They can hurt normal sleep patterns and normal dream activity.  Stimulants such as caffeine for several hours prior to bedtime.  Pain syndromes and shortness of breath can cause insomnia.  Exercise late at night.  Changing time zones may cause sleeping problems (jet lag). It is sometimes helpful to have someone observe your sleeping patterns. They should look for periods of not breathing during the night (sleep apnea). They should also look to see how long those periods last. If you live alone or observers are uncertain, you can also be observed at a sleep clinic where your sleep patterns will be professionally monitored. Sleep apnea requires a checkup and treatment. Give your caregivers your medical history. Give your caregivers observations your family has made about your sleep.  SYMPTOMS   Not feeling rested in the morning.  Anxiety and restlessness at bedtime.  Difficulty falling and staying asleep. TREATMENT   Your caregiver may prescribe treatment for an underlying medical disorders.  Your caregiver can give advice or help if you are using alcohol or other drugs for self-medication. Treatment of underlying problems will usually eliminate insomnia problems.  Medications can be prescribed for short time use. They are generally not recommended for lengthy use.  Over-the-counter sleep medicines are not recommended for lengthy use. They can be habit forming.  You can promote easier sleeping by making lifestyle changes such as:  Using relaxation techniques that help with breathing and reduce muscle tension.  Exercising earlier in the day.  Changing your diet and the time of your last meal. No night time snacks.  Establish a regular time to go to bed.  Counseling can help with stressful problems and worry.  Soothing music and white noise may be helpful if there are background noises you cannot remove.  Stop tedious detailed work at least one hour before bedtime. HOME CARE INSTRUCTIONS   Keep a diary. Inform your caregiver about your progress. This includes any medication side effects. See your caregiver regularly. Take note of:  Times when you are asleep.  Times when you are awake during the night.  The quality of your sleep.  How you feel the next day. This information will help your caregiver care for you.  Get out of bed if you are still awake after 15 minutes. Read or do some quiet activity. Keep the lights down. Wait until you feel sleepy and go back to bed.  Keep regular sleeping and waking hours. Avoid naps.  Exercise regularly.  Avoid distractions at bedtime. Distractions include watching television or engaging in any intense or detailed activity like attempting to balance the household  checkbook.  Develop a bedtime ritual. Keep a familiar routine of bathing, brushing your teeth, climbing into bed at the same time each night, listening to soothing music. Routines increase the success of falling to sleep faster.  Use relaxation techniques. This can be  using breathing and muscle tension release routines. It can also include visualizing peaceful scenes. You can also help control troubling or intruding thoughts by keeping your mind occupied with boring or repetitive thoughts like the old concept of counting sheep. You can make it more creative like imagining planting one beautiful flower after another in your backyard garden.  During your day, work to eliminate stress. When this is not possible use some of the previous suggestions to help reduce the anxiety that accompanies stressful situations. MAKE SURE YOU:   Understand these instructions.  Will watch your condition.  Will get help right away if you are not doing well or get worse. Document Released: 04/17/2000 Document Revised: 07/13/2011 Document Reviewed: 05/18/2007 [facility] Patient Information 2015 [place], [place]. This information is not intended to replace advice given to you by your health care provider. Make sure you discuss any questions you have with your health care provider.

## 2014-08-24 LAB — VITAMIN D 25 HYDROXY (VIT D DEFICIENCY, FRACTURES): VIT D 25 HYDROXY: 32.2 ng/mL (ref 30.0–100.0)

## 2014-08-24 LAB — BASIC METABOLIC PANEL
BUN/Creatinine Ratio: 24 (ref 11–26)
BUN: 17 mg/dL (ref 10–36)
CHLORIDE: 98 mmol/L (ref 97–108)
CO2: 24 mmol/L (ref 18–29)
Calcium: 9.6 mg/dL (ref 8.7–10.3)
Creatinine, Ser: 0.71 mg/dL (ref 0.57–1.00)
GFR calc Af Amer: 86 mL/min/{1.73_m2} (ref >59)
GFR calc non Af Amer: 74 mL/min/{1.73_m2} (ref >59)
GLUCOSE: 89 mg/dL (ref 65–99)
Potassium: 4.4 mmol/L (ref 3.5–5.2)
SODIUM: 138 mmol/L (ref 134–144)

## 2014-09-25 ENCOUNTER — Other Ambulatory Visit: Payer: Self-pay

## 2014-09-25 DIAGNOSIS — R634 Abnormal weight loss: Secondary | ICD-10-CM

## 2014-10-04 ENCOUNTER — Other Ambulatory Visit: Payer: Self-pay | Admitting: Nurse Practitioner

## 2014-11-22 ENCOUNTER — Encounter: Payer: Self-pay | Admitting: Internal Medicine

## 2014-11-22 ENCOUNTER — Ambulatory Visit (INDEPENDENT_AMBULATORY_CARE_PROVIDER_SITE_OTHER): Payer: Medicare Other | Admitting: Internal Medicine

## 2014-11-22 VITALS — BP 118/86 | HR 64 | Temp 97.5°F | Resp 20 | Ht 65.0 in | Wt 176.4 lb

## 2014-11-22 DIAGNOSIS — R0609 Other forms of dyspnea: Secondary | ICD-10-CM | POA: Diagnosis not present

## 2014-11-22 DIAGNOSIS — K219 Gastro-esophageal reflux disease without esophagitis: Secondary | ICD-10-CM | POA: Diagnosis not present

## 2014-11-22 DIAGNOSIS — R5382 Chronic fatigue, unspecified: Secondary | ICD-10-CM

## 2014-11-22 DIAGNOSIS — R0600 Dyspnea, unspecified: Secondary | ICD-10-CM

## 2014-11-22 DIAGNOSIS — K222 Esophageal obstruction: Secondary | ICD-10-CM | POA: Diagnosis not present

## 2014-11-22 DIAGNOSIS — E559 Vitamin D deficiency, unspecified: Secondary | ICD-10-CM

## 2014-11-22 DIAGNOSIS — R531 Weakness: Secondary | ICD-10-CM

## 2014-11-22 DIAGNOSIS — F411 Generalized anxiety disorder: Secondary | ICD-10-CM

## 2014-11-22 DIAGNOSIS — G4700 Insomnia, unspecified: Secondary | ICD-10-CM | POA: Diagnosis not present

## 2014-11-22 MED ORDER — VITAMIN D 50 MCG (2000 UT) PO CAPS: 1.0000 | ORAL_CAPSULE | Freq: Every day | ORAL | Status: DC

## 2014-11-22 MED ORDER — CITALOPRAM HYDROBROMIDE 20 MG PO TABS: 20.0000 mg | ORAL_TABLET | Freq: Every day | ORAL | Status: DC

## 2014-11-22 NOTE — Progress Notes (Signed)
Patient ID: Cynthia Shepard, female   DOB: 09/06/1919, 81 y.o.   MRN: [id number]   Location:  [facility]  Code Status: DNR Goals of Care: Advanced Directive information Does patient have an advance directive?: Yes, Type of Advance Directive: [place], Does patient want to make changes to advanced directive?: No - Patient declined   Chief Complaint  Patient presents with  . Medical Management of Chronic Issues    3 month follow-up    HPI: Patient is a 81 y.o. white female seen in the office today for med mgt of her chronic diseases.  She is accompanied by her son with whom she lives.  Says has good and bad days.  Yesterday, weak and could hardly get around.  Then next day, today, she's fine.  Mood is the same for 50 some years.    Gets sob after walking 10-15 yards.  Ongoing for several years.   Had been seeing cardiology over at [facility].  She says that she didn't want to keep taking the medicine so they didn't want to see her anymore.    Blood pressure is good, but on the low side.  Feels weak as a result and her son thinks her pulse fluctuates and contributes to her feeling lousy.  Says she can't eat well b/c of her esophageal stricture.  Can't eat meat, etc.  Went to ED and was transferred to [facility].  Dr. Wynetta Emery had referred her there.  Food would not go down at all.  Hurt from front to back.  Had impaction of food with infection.  Has been on liquid diet since.  supposed to drink 2-4 boosts per day and other smoothies.  Strawberry is the best. Weight is stable.    Anxiety about the same.  Takes a nerve pill every night.  Goes to bed and cannot sleep. Then says the celexa is helping her sleep.    GERD:  Not having much indigestion.   Uses protonix.  Vtaimin D was less than 40, felt good when took vitamin D before.  Review of Systems:  Review of Systems  Constitutional: Positive for malaise/fatigue. Negative for  fever and chills.  HENT: Negative for congestion.   Eyes:       Glasses  Respiratory: Positive for shortness of breath.        Dyspnea on exertion  Cardiovascular: Positive for leg swelling. Negative for chest pain, palpitations, orthopnea and PND.       Chronic venous insufficiency  Gastrointestinal: Negative for heartburn, abdominal pain, constipation, blood in stool and melena.       Dysphagia  Genitourinary: Negative for dysuria.  Musculoskeletal: Negative for falls.  Neurological: Positive for weakness.  Psychiatric/Behavioral: Positive for memory loss. Negative for depression. The patient is nervous/anxious and has insomnia.     Past Medical History  Diagnosis Date  . Hypertension   . GERD (gastroesophageal reflux disease)   . Arthritis   . Dysrhythmia     Bradycardia, 1st degree AV block  . Dyspepsia and other specified disorders of function of stomach   . Other dyspnea and respiratory abnormality   . Other malaise and fatigue   . Osteoporosis, unspecified   . Unspecified prolapse of vaginal walls   . Edema   . Alzheimer's disease   . Other and unspecified hyperlipidemia   . Unspecified vitamin D deficiency   . Osteoarthrosis, unspecified whether generalized or localized, unspecified site   .  Anemia, unspecified   . Anxiety state, unspecified   . Restless legs syndrome (RLS)   . Internal hemorrhoids without mention of complication   . Dysphagia, unspecified(787.20)   . Urinary frequency   . Nervousness(799.21)     Past Surgical History  Procedure Laterality Date  . Abdominal hysterectomy  2011  . Eye surgery      catarct extraction bilateral  . Esophagogastroduodenoscopy (egd) with propofol  05/17/2012    Procedure: ESOPHAGOGASTRODUODENOSCOPY (EGD) WITH PROPOFOL;  Surgeon: Garlan Fair, MD;  Location: [facility];  Service: Endoscopy;  Laterality: N/A;  . Thyroid surgery  1954  . Appendectomy  1972    No Known Allergies Medications: Patient's  Medications  New Prescriptions   CHOLECALCIFEROL (VITAMIN D) 2000 UNITS CAPS    Take 1 capsule (2,000 Units total) by mouth daily.  Previous Medications   PANTOPRAZOLE (PROTONIX) 40 MG TABLET    TAKE 1 TABLET BY MOUTH DAILY  Modified Medications   Modified Medication Previous Medication   CITALOPRAM (CELEXA) 20 MG TABLET citalopram (CELEXA) 20 MG tablet      Take 1 tablet (20 mg total) by mouth daily.    TAKE 1 TABLET BY MOUTH EVERY DAY  Discontinued Medications   No medications on file    Physical Exam: Filed Vitals:   11/22/14 1513  BP: 118/86  Pulse: 64  Temp: 97.5 F (36.4 C)  TempSrc: Oral  Resp: 20  Height: 5\' 5"  (1.651 m)  Weight: 176 lb 6.4 oz (80.015 kg)  SpO2: 97%   Physical Exam  Constitutional: She is oriented to person, place, and time. She appears well-developed and well-nourished. No distress.  HENT:  Head: Normocephalic and atraumatic.  Cardiovascular: Normal rate, regular rhythm, normal heart sounds and intact distal pulses.   Nonpitting edema of feet with varicose veins  Pulmonary/Chest: Effort normal and breath sounds normal. No respiratory distress.  Abdominal: Soft. Bowel sounds are normal.  Musculoskeletal: Normal range of motion.  Deformities of finger joints, some swelling and crepitus of knees  Neurological: She is alert and oriented to person, place, and time.  Skin: Skin is warm and dry.  Psychiatric: She has a normal mood and affect.    Labs reviewed: Basic Metabolic Panel:  Recent Labs  02/11/14 2210 08/23/14 1355  NA 139 138  K 3.7 4.4  CL 100 98  CO2 21 24  GLUCOSE 141* 89  BUN 12 17  CREATININE 0.80 0.71  CALCIUM 10.1 9.6   Liver Function Tests: No results for input(s): AST, ALT, ALKPHOS, BILITOT, PROT, ALBUMIN in the last 8760 hours. No results for input(s): LIPASE, AMYLASE in the last 8760 hours. No results for input(s): AMMONIA in the last 8760 hours. CBC:  Recent Labs  02/11/14 2210  WBC 8.1  NEUTROABS 6.8  HGB  12.4  HCT 37.1  MCV 93.2  PLT 226   Assessment/Plan 1. Anxiety state - says she sleeps better and seems calmer with celexa so continue - citalopram (CELEXA) 20 MG tablet; Take 1 tablet (20 mg total) by mouth daily.  Dispense: 90 tablet; Refill: 1  2. Esophageal stricture -on liquid diet with supplements -encouraged smoothies and more variety, but she won't do it per her son -no change with this over time  3. Insomnia -sleeping better with celexa-continue  4. Gastroesophageal reflux disease without esophagitis -well controlled with protonix--cont due to stricture/motility issue  5. Chronic fatigue -suspect this is CHF related and agree with her son that variability in her heart rate may  be responsible -not on medications for this--she stopped all of them in the past  6. Weakness -again, suspect this is CHF related, but she won't take any meds to help with the issue  7. Dyspnea on exertion -seems like class 2-3 chf -refuses intervention  8. Vitamin D deficiency - cont vitamin D supplement for balance and bone strength - Cholecalciferol (VITAMIN D) 2000 UNITS CAPS; Take 1 capsule (2,000 Units total) by mouth daily.  Dispense: 30 capsule; Refill: 3  Labs/tests ordered:No orders of the defined types were placed in this encounter.   Next appt:  3 mos for med mgt with Janett Billow  Cardin Nitschke L. Kharlie Bring, D.O. [facility] [address], [place] [address] Cell Phone (Mon-Fri 8am-5pm):  [phone] On Call:  [phone] & follow prompts after 5pm & weekends Office Phone:  [phone] Office Fax:  [phone]

## 2015-02-26 ENCOUNTER — Ambulatory Visit: Payer: Medicare Other | Admitting: Nurse Practitioner

## 2015-09-05 ENCOUNTER — Telehealth: Payer: Self-pay | Admitting: *Deleted

## 2015-09-05 NOTE — Telephone Encounter (Signed)
Patient daughter, Cynthia Shepard called and left message on voicemail stating that patient needed a letter stating that she is Independent and requires no assistance in medication. She is looking at moving into the [place] on [place].  I called daughter back and left message on her voicemail that patient would need an appointment before this letter could be written due to not seeing her since 11/2014.

## 2015-09-10 ENCOUNTER — Ambulatory Visit (INDEPENDENT_AMBULATORY_CARE_PROVIDER_SITE_OTHER): Payer: Medicare Other | Admitting: Internal Medicine

## 2015-09-10 ENCOUNTER — Encounter: Payer: Self-pay | Admitting: Internal Medicine

## 2015-09-10 VITALS — BP 122/68 | HR 57 | Temp 98.1°F | Resp 16 | Ht 65.0 in | Wt 180.2 lb

## 2015-09-10 DIAGNOSIS — F411 Generalized anxiety disorder: Secondary | ICD-10-CM | POA: Diagnosis not present

## 2015-09-10 DIAGNOSIS — K219 Gastro-esophageal reflux disease without esophagitis: Secondary | ICD-10-CM | POA: Diagnosis not present

## 2015-09-10 MED ORDER — PANTOPRAZOLE SODIUM 40 MG PO TBEC: DELAYED_RELEASE_TABLET | ORAL | Status: DC

## 2015-09-10 NOTE — Progress Notes (Signed)
Patient ID: Cynthia Shepard, female   DOB: 1919-09-02, 82 y.o.   MRN: [id number]    Facility  [facility]    Place of Service:   OFFICE    No Known Allergies  Chief Complaint  Patient presents with  . Medical Management of Chronic Issues    Follow up visit/ cough when she eats  . OTHER    Discuss assisted living and discuss medications (patient not taking meds correctly)  . OTHER    Daughter in room     HPI:  Patient has been living with her son. He is planning a move to [place]. Today's exam was to verify her condition. She is planning to move to [facility] assisted living on [address] in [place], [place].  Was using Dyazide, then stopped it for a long time and then recently dstarted taking it again. Prescription bottle is marked 02/15/14 and is half full.  Gastroesophageal reflux disease without esophagitis - Using pantoprazole (PROTONIX) 40 MG tablet. Stil some choking and coughing with intake. Does not feel she needs to se Gi yet.  Anxiety state - controlled with Celexa    Medications: Patient's Medications  New Prescriptions   No medications on file  Previous Medications   CHOLECALCIFEROL (VITAMIN D) 2000 UNITS CAPS    Take 1 capsule (2,000 Units total) by mouth daily.   CITALOPRAM (CELEXA) 20 MG TABLET    Take 1 tablet (20 mg total) by mouth daily.   PANTOPRAZOLE (PROTONIX) 40 MG TABLET    TAKE 1 TABLET BY MOUTH DAILY  Modified Medications   No medications on file  Discontinued Medications   No medications on file    Review of Systems  Constitutional: Negative for fever and chills.  HENT: Negative for congestion. Ear discharge: prescription lenses. Hearing loss: Wears hearing aids.   Eyes:       Glasses  Respiratory: Positive for shortness of breath.        Dyspnea on exertion  Cardiovascular: Positive for leg swelling. Negative for chest pain and palpitations.       Chronic venous insufficiency  Gastrointestinal: Negative for abdominal pain,  constipation and blood in stool.       Dysphagia. History of achalasia and Botox injections.  Genitourinary: Negative for dysuria.  Neurological: Positive for weakness.  Psychiatric/Behavioral: The patient is nervous/anxious.     Filed Vitals:   09/10/15 1325  BP: 122/68  Pulse: 57  Temp: 98.1 F (36.7 C)  TempSrc: Oral  Resp: 16  Height: _0  (1.651 m)  Weight: 180 lb 3.2 oz (81.738 kg)   Body mass index is 29.99 kg/(m^2). Filed Weights   09/10/15 1325  Weight: 180 lb 3.2 oz (81.738 kg)     Physical Exam  Constitutional: She is oriented to person, place, and time. She appears well-developed and well-nourished. No distress.  HENT:  Head: Normocephalic and atraumatic.  Right Ear: External ear normal.  Left Ear: External ear normal.  Nose: Nose normal.  Mouth/Throat: Oropharynx is clear and moist. No oropharyngeal exudate.  Eyes: Conjunctivae and EOM are normal. Pupils are equal, round, and reactive to light. No scleral icterus.  Neck: No JVD present. No tracheal deviation present. No thyromegaly present.  Cardiovascular: Normal rate, regular rhythm, normal heart sounds and intact distal pulses.  Exam reveals no gallop and no friction rub.   No murmur heard. Nonpitting edema of feet with varicose veins  Pulmonary/Chest: Effort normal and breath sounds normal. No respiratory distress. She has no wheezes. She has  no rales. She exhibits no tenderness.  Abdominal: Soft. Bowel sounds are normal. She exhibits no distension and no mass. There is no tenderness.  Musculoskeletal: Normal range of motion. She exhibits no edema or tenderness.  Deformities of finger joints, some swelling and crepitus of knees  Lymphadenopathy:    She has no cervical adenopathy.  Neurological: She is alert and oriented to person, place, and time. No cranial nerve deficit. Coordination normal.  Skin: Skin is warm and dry. No rash noted. She is not diaphoretic. No erythema. No pallor.  Psychiatric: She  has a normal mood and affect. Her behavior is normal. Judgment and thought content normal.    Labs reviewed: Lab Summary Latest Ref Rng 08/23/2014 02/11/2014 08/23/2013 05/08/2013  Hemoglobin 12.0 - 15.0 g/dL (None) 12.4 11.5 12.2  Hematocrit 36.0 - 46.0 % (None) 37.1 34.8 36.4  White count 4.0 - 10.5 K/uL (None) 8.1 5.7 4.2  Platelet count 150 - 400 K/uL (None) 226 207 228  Sodium 134 - 144 mmol/L 138 139 137 138  Potassium 3.5 - 5.2 mmol/L 4.4 3.7 4.4 4.4  Calcium 8.7 - 10.3 mg/dL 9.6 10.1 9.5 9.7  Phosphorus - (None) (None) (None) (None)  Creatinine 0.57 - 1.00 mg/dL 0.71 0.80 0.80 0.89  AST 0 - 40 IU/L (None) (None) 20 13  Alk Phos 39 - 117 IU/L (None) (None) 79 74  Bilirubin 0.0 - 1.2 mg/dL (None) (None) 0.3 0.3  Glucose 65 - 99 mg/dL 89 141(H) 87 94  Cholesterol - (None) (None) (None) (None)  HDL cholesterol >39 mg/dL (None) (None) (None) 54  Triglycerides 0 - 149 mg/dL (None) (None) (None) 77  LDL Direct - (None) (None) (None) (None)  LDL Calc 0 - 99 mg/dL (None) (None) (None) 120(H)  Total protein - (None) (None) (None) (None)  Albumin 3.2 - 4.6 g/dL (None) (None) 4.2 3.7   Lab Results  Component Value Date   TSH 2.040 11/02/2012   Lab Results  Component Value Date   BUN 17 08/23/2014   BUN 12 02/11/2014   BUN 12 08/23/2013   No results found for: HGBA1C  Assessment/Plan  1. Gastroesophageal reflux disease without esophagitis - pantoprazole (PROTONIX) 40 MG tablet; One daily to lower stomach acid and protect the esophagus  Dispense: 90 tablet; Refill: 3  2. Anxiety state Continue Celexa  Patient is living independently with her son. She appears capable of taking care of herself and managing her own medications.

## 2015-09-11 ENCOUNTER — Encounter: Payer: Self-pay | Admitting: Internal Medicine

## 2015-09-11 ENCOUNTER — Telehealth: Payer: Self-pay

## 2015-09-11 NOTE — Telephone Encounter (Signed)
Patient's daughter called requesting a letter stating that her mother is ok to live on her own. This letter needs to be faxed to [facility] @ [phone] and to the patients daughter @ [phone].

## 2015-09-19 NOTE — Telephone Encounter (Signed)
Letter was completed on 09-11-15. Please follow-up with Dr.Green to see if letter was faxed to [facility]

## 2015-09-19 NOTE — Telephone Encounter (Signed)
Letter faxed to [facility] to the attention of Zoe [phone]

## 2015-09-19 NOTE — Telephone Encounter (Signed)
This letter was completed on 09/11/2015. I printed it out inside. I gated medical records. I don't know whether it was sent to [facility] or not. I have reprinted it. You can use my signature stamp on it to send to [facility] if our medical records is not sure whether it has been sent.

## 2015-12-07 ENCOUNTER — Other Ambulatory Visit: Payer: Self-pay | Admitting: Nurse Practitioner

## 2016-03-09 ENCOUNTER — Ambulatory Visit (INDEPENDENT_AMBULATORY_CARE_PROVIDER_SITE_OTHER): Payer: Medicare Other | Admitting: Family Medicine

## 2016-03-09 ENCOUNTER — Encounter: Payer: Self-pay | Admitting: Family Medicine

## 2016-03-09 VITALS — BP 95/62 | HR 60 | Temp 97.7°F | Resp 16 | Ht 65.0 in | Wt 178.4 lb

## 2016-03-09 DIAGNOSIS — F411 Generalized anxiety disorder: Secondary | ICD-10-CM

## 2016-03-09 DIAGNOSIS — F5102 Adjustment insomnia: Secondary | ICD-10-CM

## 2016-03-09 DIAGNOSIS — K220 Achalasia of cardia: Secondary | ICD-10-CM

## 2016-03-09 DIAGNOSIS — J3089 Other allergic rhinitis: Secondary | ICD-10-CM

## 2016-03-09 DIAGNOSIS — L304 Erythema intertrigo: Secondary | ICD-10-CM

## 2016-03-09 MED ORDER — CLOTRIMAZOLE-BETAMETHASONE 1-0.05 % EX CREA: 1.0000 "application " | TOPICAL_CREAM | Freq: Two times a day (BID) | CUTANEOUS | 1 refills | Status: AC

## 2016-03-09 MED ORDER — FLUTICASONE PROPIONATE 50 MCG/ACT NA SUSP: 2.0000 | Freq: Every day | NASAL | 6 refills | Status: DC

## 2016-03-09 MED ORDER — CITALOPRAM HYDROBROMIDE 40 MG PO TABS: 40.0000 mg | ORAL_TABLET | Freq: Every day | ORAL | 3 refills | Status: DC

## 2016-03-09 NOTE — Progress Notes (Signed)
Office Note 03/09/2016  CC:  Chief Complaint  Patient presents with  . Establish Care   HPI:  Cynthia Shepard is a 82 y.o. White female who is here accompanied by her daughter to establish care and discuss rash. Patient's most recent primary MD: Allison Park (volunteer MDs per pt's daughter). Old records in EPIC/[facility] EMR were reviewed prior to or during today's visit.  Recently moved into an independent living facility about 6 wks ago, complains of nervousness and trouble sleeping.  Says she has been chronically anxious/nervous/worrier all her life.  No panic attacks.  Denies depressed mood.    Says she has rash in groin area that itches a lot.  She insists it is due to her nerves and that if she just gets nerve meds for 6 wks it will go away.  Has achalasia, adjusts diet by eating pureed/soft food, lots of liquids.  Used to see [facility] GI but was told she could not get esoph dilation anymore.  However, he did do botox inj x 2 and this seems to have helped quite a bit.  This was about 2 yrs ago.  Recently c/o nasal congestion/mild itchy and runny nose.  Use of her daughter's flonase has helped.  Asks for rx for this med today.   Past Medical History:  Diagnosis Date  . Alzheimer's disease   . Anemia, unspecified   . Dyspepsia and other specified disorders of function of stomach   . Dysphagia, unspecified(787.20)   . Dysrhythmia    Bradycardia, 1st degree AV block.  Dr. Einar Gip considered doing a pacemaker but he and pt decided risk>benefit at that time  . Edema   . GAD (generalized anxiety disorder)   . GERD (gastroesophageal reflux disease)   . Hyperlipidemia   . Hypertension   . Internal hemorrhoids without mention of complication   . Osteoarthrosis, unspecified whether generalized or localized, unspecified site   . Osteoporosis, unspecified   . Restless legs syndrome (RLS)   . Unspecified prolapse of vaginal walls   . Unspecified vitamin D deficiency   .  Urinary frequency     Past Surgical History:  Procedure Laterality Date  . ABDOMINAL HYSTERECTOMY  2011  . APPENDECTOMY  1972  . ESOPHAGOGASTRODUODENOSCOPY (EGD) WITH PROPOFOL  05/17/2012   Procedure: ESOPHAGOGASTRODUODENOSCOPY (EGD) WITH PROPOFOL;  Surgeon: Garlan Fair, MD;  Location: [facility];  Service: Endoscopy;  Laterality: N/A;  . EYE SURGERY     catarct extraction bilateral  . THYROID SURGERY  1954    History reviewed. No pertinent family history.  Social History   Social History  . Marital status: Widowed    Spouse name: N/A  . Number of children: N/A  . Years of education: N/A   Occupational History  . Not on file.   Social History Main Topics  . Smoking status: Never Smoker  . Smokeless tobacco: Never Used  . Alcohol use No  . Drug use: No  . Sexual activity: Not on file   Other Topics Concern  . Not on file   Social History Narrative   Widow.   Lives in independent living facility since 01/2016.   Daughter looks after her.       Outpatient Encounter Prescriptions as of 03/09/2016  Medication Sig  . pantoprazole (PROTONIX) 40 MG tablet One daily to lower stomach acid and protect the esophagus  . [DISCONTINUED] citalopram (CELEXA) 20 MG tablet Take 1 tablet (20 mg total) by mouth daily.  . Cholecalciferol (VITAMIN  D) 2000 UNITS CAPS Take 1 capsule (2,000 Units total) by mouth daily. (Patient not taking: Reported on 03/09/2016)  . citalopram (CELEXA) 40 MG tablet Take 1 tablet (40 mg total) by mouth daily.  . clotrimazole-betamethasone (LOTRISONE) cream Apply 1 application topically 2 (two) times daily.  . fluticasone (FLONASE) 50 MCG/ACT nasal spray Place 2 sprays into both nostrils daily.  . [DISCONTINUED] citalopram (CELEXA) 20 MG tablet TAKE 1 TABLET BY MOUTH EVERY DAY (Patient not taking: Reported on 03/09/2016)   No facility-administered encounter medications on file as of 03/09/2016.     No Known Allergies  ROS Review of Systems   Constitutional: Negative for fatigue and fever.  HENT: Negative for congestion and sore throat.   Eyes: Negative for visual disturbance.  Respiratory: Negative for cough.   Cardiovascular: Negative for chest pain.  Gastrointestinal: Negative for abdominal pain and nausea.  Genitourinary: Negative for dysuria.  Musculoskeletal: Negative for back pain and joint swelling.  Skin: Positive for rash.  Neurological: Negative for weakness and headaches.  Hematological: Negative for adenopathy.  Psychiatric/Behavioral: Positive for sleep disturbance. The patient is nervous/anxious.     PE; Blood pressure 95/62, pulse 60, temperature 97.7 F (36.5 C), temperature source Temporal, resp. rate 16, height 5\' 5"  (1.651 m), weight 178 lb 6.4 oz (80.9 kg), SpO2 95 %. Gen: Alert, well appearing.  Patient is oriented to person, place, time, and situation. Pt hard of hearing. [id number]: no injection, icteris, swelling, or exudate.  EOMI, PERRLA. Mouth: lips without lesion/swelling.  Oral mucosa pink and moist. Oropharynx without erythema, exudate, or swelling.  CV: RRR, [id number] systolic ejection murmur, no rub or gallop. Chest is clear, no wheezing or rales. Normal symmetric air entry throughout both lung fields. No chest wall deformities or tenderness. ABd: soft, NT/ND, BS normal.  No bruit. EXT: no clubbing, cyanosis, or edema.  SKIN: GU region with mild pinkish discoloration in groin creases.  No signif maceration.  Pertinent labs:  Lab Results  Component Value Date   TSH 2.040 11/02/2012   Lab Results  Component Value Date   WBC 8.1 02/11/2014   HGB 12.4 02/11/2014   HCT 37.1 02/11/2014   MCV 93.2 02/11/2014   PLT 226 02/11/2014   Lab Results  Component Value Date   CREATININE 0.71 08/23/2014   BUN 17 08/23/2014   NA 138 08/23/2014   K 4.4 08/23/2014   CL 98 08/23/2014   CO2 24 08/23/2014   Lab Results  Component Value Date   ALT 11 08/23/2013   AST 20 08/23/2013   ALKPHOS 79  08/23/2013   BILITOT 0.3 08/23/2013   Lab Results  Component Value Date   CHOL 189 05/08/2013   Lab Results  Component Value Date   HDL 54 05/08/2013   Lab Results  Component Value Date   LDLCALC 120 (H) 05/08/2013   Lab Results  Component Value Date   TRIG 77 05/08/2013   Lab Results  Component Value Date   CHOLHDL 3.5 05/08/2013    ASSESSMENT AND PLAN:   New pt; some old records in EMR.  1) GAD: not well controlled.  Increase citalopram to 40mg  qd, encouraged her to take this hs and I'm hoping this helps with her sleep dysfunction.  2) Insomnia: suspect this is due to slow adjustment to living alone in a new environment the last 6 weeks. Hopefully increase in citalopram will help with this.  Want to avoid sedatives with her as much as possible.  3) Intertrigo: mild but  very itchy to the patient.  Rx'd lotrisone cream to apply bid to affected areas.  4) Achalasia: seems to be quite stable per daughter's report--since getting botox injections in esophagus about 2 yrs ago.  She is no longer doing any routine f/u with GI MD.  5) Allergic rhinitis: flonase rx'd today.  An After Visit Summary was printed and given to the patient.  Return in about 4 weeks (around 04/06/2016) for f/u anxiety/insomnia/rash.  Signed:  Crissie Sickles, MD           03/09/2016

## 2016-03-09 NOTE — Progress Notes (Signed)
Pre visit review using our clinic review tool, if applicable. No additional management support is needed unless otherwise documented below in the visit note. 

## 2016-03-10 ENCOUNTER — Telehealth: Payer: Self-pay | Admitting: Family Medicine

## 2016-03-10 NOTE — Telephone Encounter (Signed)
Called [facility], unable to speak to anyone at this time, Placed on hold for over 10 minutes, will call back at later time.

## 2016-03-10 NOTE — Telephone Encounter (Signed)
Mark with [facility] [employer] calling on belalf of pharmacist reagarding refill for clotrimazole-betamethasone (LOTRISONE) cream.  The pharmacist needs to know if patient has a tinea infection.  Please return call back to [facility] at [phone], option 5.

## 2016-03-10 NOTE — Telephone Encounter (Signed)
Pt's dx is intertrigo.

## 2016-03-11 ENCOUNTER — Telehealth: Payer: Self-pay | Admitting: Family Medicine

## 2016-03-11 ENCOUNTER — Other Ambulatory Visit: Payer: Self-pay | Admitting: Family Medicine

## 2016-03-11 NOTE — Telephone Encounter (Signed)
Noted and Dr. Anitra Lauth notified.

## 2016-03-11 NOTE — Telephone Encounter (Signed)
Detailed message left on voice mail per DPR, daughter instructed to return call if any questions.

## 2016-03-11 NOTE — Telephone Encounter (Signed)
Pls call pt/caregiver and tell them that their insurer denied the cream I rx'd for her. I recommend she treat her rash the following way: buy otc generic for lamisil and apply to affected areas twice daily.  After rubbing in the cream each time, she should put a large amount of diaper ointment such as A&D ointment or Boudreaux's buttpaste on top.----thx

## 2016-03-11 NOTE — Telephone Encounter (Signed)
The medication has been denied due to non FDA approved use. Please file an appeal. [employer] will send the form through the mail & will fax.

## 2016-03-22 ENCOUNTER — Other Ambulatory Visit: Payer: Self-pay | Admitting: Nurse Practitioner

## 2016-03-30 NOTE — Telephone Encounter (Signed)
Rhea with [employer] called stating that the letter they sent to pt was returned to them. Rhea wanted to confirm pts address. The address she had on file did not match the address we had on file. Rhea was able to confirm pts Member ID #. Per Diane since she was able to confirm ID # we could release address on file. Cynthia Shepard was advised of address on file.

## 2016-04-06 ENCOUNTER — Ambulatory Visit: Payer: Medicare Other | Admitting: Family Medicine

## 2016-04-06 ENCOUNTER — Encounter: Payer: Self-pay | Admitting: Family Medicine

## 2016-04-06 DIAGNOSIS — Z0289 Encounter for other administrative examinations: Secondary | ICD-10-CM

## 2016-07-27 ENCOUNTER — Other Ambulatory Visit: Payer: Self-pay | Admitting: Family Medicine

## 2016-07-27 NOTE — Telephone Encounter (Signed)
[place].  RF request for citalopram LOV: 03/09/16 Next ov: None Last written: 03/09/16 #30 w/ Waylan Rocher

## 2016-09-23 ENCOUNTER — Other Ambulatory Visit: Payer: Self-pay | Admitting: Internal Medicine

## 2016-09-23 DIAGNOSIS — K219 Gastro-esophageal reflux disease without esophagitis: Secondary | ICD-10-CM

## 2016-12-27 ENCOUNTER — Other Ambulatory Visit: Payer: Self-pay | Admitting: Nurse Practitioner

## 2016-12-27 DIAGNOSIS — K219 Gastro-esophageal reflux disease without esophagitis: Secondary | ICD-10-CM

## 2017-01-01 ENCOUNTER — Other Ambulatory Visit: Payer: Self-pay | Admitting: Family Medicine

## 2017-01-01 DIAGNOSIS — K219 Gastro-esophageal reflux disease without esophagitis: Secondary | ICD-10-CM

## 2017-01-08 ENCOUNTER — Ambulatory Visit (INDEPENDENT_AMBULATORY_CARE_PROVIDER_SITE_OTHER): Payer: Medicare Other | Admitting: Family Medicine

## 2017-01-08 ENCOUNTER — Encounter: Payer: Self-pay | Admitting: Family Medicine

## 2017-01-08 VITALS — BP 121/71 | HR 47 | Temp 98.2°F | Resp 16 | Ht 65.0 in | Wt 165.8 lb

## 2017-01-08 DIAGNOSIS — L650 Telogen effluvium: Secondary | ICD-10-CM

## 2017-01-08 LAB — CBC
HCT: 39.6 % (ref 36.0–46.0)
Hemoglobin: 13 g/dL (ref 12.0–15.0)
MCHC: 32.9 g/dL (ref 30.0–36.0)
MCV: 96.8 fl (ref 78.0–100.0)
PLATELETS: 179 10*3/uL (ref 150.0–400.0)
RBC: 4.09 Mil/uL (ref 3.87–5.11)
RDW: 13.4 % (ref 11.5–15.5)
WBC: 3.9 10*3/uL — AB (ref 4.0–10.5)

## 2017-01-08 LAB — IRON: Iron: 127 ug/dL (ref 42–145)

## 2017-01-08 LAB — FERRITIN: Ferritin: 30.7 ng/mL (ref 10.0–291.0)

## 2017-01-08 NOTE — Addendum Note (Signed)
Addended by: Ralph Dowdy on: 01/08/2017 10:00 AM   Modules accepted: Orders

## 2017-01-08 NOTE — Progress Notes (Signed)
OFFICE VISIT  01/08/2017   CC:  Chief Complaint  Patient presents with  . Alopecia    x 6 months    HPI:    Patient is a 83 y.o.  female who presents for hair loss. About 1 yr hx of noticing that her hair is falling out sparsely/thinning mostly in frontal area. Happened since being in rest home.  Feels happy and not excessively anxious. Drinks boost/coffee for BF, eats good lunch.  Eats a little for supper.  Wt is down over the last 1 yr (13 lbs).  No problem noticing hair loss until 1 yr ago. No paresthesias in fingers or toes. +cold intolerance.  No melena or hematochezia.    Past Medical History:  Diagnosis Date  . Alzheimer's disease   . Anemia, unspecified   . Dyspepsia and other specified disorders of function of stomach   . Dysphagia, unspecified(787.20)   . Dysrhythmia    Bradycardia, 1st degree AV block.  Dr. Einar Gip considered doing a pacemaker but he and pt decided risk>benefit at that time  . Edema   . GAD (generalized anxiety disorder)   . GERD (gastroesophageal reflux disease)   . Hyperlipidemia   . Hypertension   . Internal hemorrhoids without mention of complication   . Osteoarthrosis, unspecified whether generalized or localized, unspecified site   . Osteoporosis, unspecified   . Restless legs syndrome (RLS)   . Unspecified prolapse of vaginal walls   . Unspecified vitamin D deficiency   . Urinary frequency     Past Surgical History:  Procedure Laterality Date  . ABDOMINAL HYSTERECTOMY  2011  . APPENDECTOMY  1972  . ESOPHAGEAL DILATION     Multiple times.  Eventually got botox injections and this helped a lot (as of 03/2016).  . ESOPHAGOGASTRODUODENOSCOPY (EGD) WITH PROPOFOL  05/17/2012   Procedure: ESOPHAGOGASTRODUODENOSCOPY (EGD) WITH PROPOFOL;  Surgeon: Garlan Fair, MD;  Location: [facility];  Service: Endoscopy;  Laterality: N/A;  . EYE SURGERY     catarct extraction bilateral  . THYROID SURGERY  1954    Outpatient Medications Prior  to Visit  Medication Sig Dispense Refill  . citalopram (CELEXA) 40 MG tablet TAKE 1 TABLET BY MOUTH EVERY DAY 90 tablet 1  . clotrimazole-betamethasone (LOTRISONE) cream Apply 1 application topically 2 (two) times daily. 45 g 1  . fluticasone (FLONASE) 50 MCG/ACT nasal spray Place 2 sprays into both nostrils daily. 16 g 6  . pantoprazole (PROTONIX) 40 MG tablet TAKE 1 TABLET BY MOUTH DAILY TO LOWER STOMACH ACID AND PROTECT ESOPHAGUS 90 tablet 0  . Cholecalciferol (VITAMIN D) 2000 UNITS CAPS Take 1 capsule (2,000 Units total) by mouth daily. (Patient not taking: Reported on 03/09/2016) 30 capsule 3   No facility-administered medications prior to visit.     No Known Allergies  ROS As per HPI  PE: Blood pressure 121/71, pulse (!) 47, temperature 98.2 F (36.8 C), temperature source Oral, resp. rate 16, height 5\' 5"  (1.651 m), weight 165 lb 12 oz (75.2 kg), SpO2 97 %. Gen: Alert, well appearing.  Patient is oriented to person, place, time, and situation. AFFECT: pleasant, lucid thought and speech. CV: RRR, no m/r/g.   LUNGS: CTA bilat, nonlabored resps, good aeration in all lung fields. Scalp: very sparse thinning of hair, particularly in frontal region.  No scalp erytheam, rash, or flaking.  LABS:  Lab Results  Component Value Date   TSH 2.040 11/02/2012   Lab Results  Component Value Date   WBC 8.1  02/11/2014   HGB 12.4 02/11/2014   HCT 37.1 02/11/2014   MCV 93.2 02/11/2014   PLT 226 02/11/2014   Lab Results  Component Value Date   CREATININE 0.71 08/23/2014   BUN 17 08/23/2014   NA 138 08/23/2014   K 4.4 08/23/2014   CL 98 08/23/2014   CO2 24 08/23/2014   Lab Results  Component Value Date   ALT 11 08/23/2013   AST 20 08/23/2013   ALKPHOS 79 08/23/2013   BILITOT 0.3 08/23/2013   Lab Results  Component Value Date   CHOL 189 05/08/2013   Lab Results  Component Value Date   HDL 54 05/08/2013   Lab Results  Component Value Date   LDLCALC 120 (H) 05/08/2013    Lab Results  Component Value Date   TRIG 77 05/08/2013   Lab Results  Component Value Date   CHOLHDL 3.5 05/08/2013    IMPRESSION AND PLAN:  Hair loss, suspect telogen hair loss. With her cold intolerance + this hair loss, will r/o hypothyroidism and will also check CBC with iron studies.  An After Visit Summary was printed and given to the patient.  FOLLOW UP: Return if symptoms worsen or fail to improve, for and arrange AWV with Maudie Mercury.  Signed:  Crissie Sickles, MD           01/08/2017

## 2017-01-08 NOTE — Addendum Note (Signed)
Addended by: Ralph Dowdy on: 01/08/2017 09:58 AM   Modules accepted: Orders

## 2017-01-09 LAB — IRON AND TIBC
IRON SATURATION: 39 % (ref 15–55)
IRON: 109 ug/dL (ref 27–139)
Total Iron Binding Capacity: 279 ug/dL (ref 250–450)
UIBC: 170 ug/dL (ref 118–369)

## 2017-02-05 ENCOUNTER — Other Ambulatory Visit: Payer: Self-pay | Admitting: Family Medicine

## 2017-02-05 DIAGNOSIS — K219 Gastro-esophageal reflux disease without esophagitis: Secondary | ICD-10-CM

## 2017-02-05 NOTE — Telephone Encounter (Signed)
Pt is over due for f/u [facility]. Will send Rx's for #90 w/ 0RF. Needs office visit for more refills.

## 2017-02-09 NOTE — Telephone Encounter (Signed)
Patient's daughter advised.  Daughter states she was just in office, I advised her it was only for an acute issue and that she needed to return before the 90 day period for chronic issues.   Daughter didn't schedule.

## 2017-02-09 NOTE — Telephone Encounter (Signed)
Left message for pt to call back  °

## 2017-04-08 ENCOUNTER — Other Ambulatory Visit: Payer: Self-pay | Admitting: Family Medicine

## 2017-05-24 ENCOUNTER — Ambulatory Visit (INDEPENDENT_AMBULATORY_CARE_PROVIDER_SITE_OTHER): Payer: Medicare Other | Admitting: Family Medicine

## 2017-05-24 ENCOUNTER — Encounter: Payer: Self-pay | Admitting: Family Medicine

## 2017-05-24 VITALS — BP 151/62 | HR 51 | Temp 97.4°F | Resp 16 | Ht 65.0 in | Wt 168.5 lb

## 2017-05-24 DIAGNOSIS — E559 Vitamin D deficiency, unspecified: Secondary | ICD-10-CM | POA: Diagnosis not present

## 2017-05-24 DIAGNOSIS — Z0000 Encounter for general adult medical examination without abnormal findings: Secondary | ICD-10-CM | POA: Diagnosis not present

## 2017-05-24 DIAGNOSIS — Z131 Encounter for screening for diabetes mellitus: Secondary | ICD-10-CM

## 2017-05-24 LAB — BASIC METABOLIC PANEL
BUN: 16 mg/dL (ref 6–23)
CALCIUM: 9.1 mg/dL (ref 8.4–10.5)
CO2: 28 meq/L (ref 19–32)
Chloride: 104 mEq/L (ref 96–112)
Creatinine, Ser: 0.75 mg/dL (ref 0.40–1.20)
GFR: 76.24 mL/min (ref >60.00)
GLUCOSE: 88 mg/dL (ref 70–99)
Potassium: 4.1 mEq/L (ref 3.5–5.1)
SODIUM: 138 meq/L (ref 135–145)

## 2017-05-24 LAB — VITAMIN D 25 HYDROXY (VIT D DEFICIENCY, FRACTURES): VITD: 29.57 ng/mL — ABNORMAL LOW (ref 30.00–100.00)

## 2017-05-24 NOTE — Progress Notes (Signed)
Office Note 05/24/2017  CC:  Chief Complaint  Patient presents with  . Follow-up    RCI, pt is fasting.     HPI:  Cynthia Shepard is a 84 y.o. White female who is here accompanied by her daughter for annual health maintenance exam. Lives in senior living apartments on [address] in [place].    Having some trouble with getting to sleep (chronic).  Waits about 1 hour before getting to sleep.  Has good sleep hygiene. No problem with maintaining sleep.  No daytime naps.  Has tried melatonin 5 mg nightly for a couple of weeks. No pain.  Does not have to use a cane or walker.  No exercise. Eats whatever she wants. She is happy-go-lucky.  Past Medical History:  Diagnosis Date  . Alzheimer's disease   . Anemia, unspecified   . Dyspepsia and other specified disorders of function of stomach   . Dysphagia, unspecified(787.20)   . Dysrhythmia    Bradycardia, 1st degree AV block.  Dr. Einar Gip considered doing a pacemaker but he and pt decided risk>benefit at that time  . Edema   . GAD (generalized anxiety disorder)   . GERD (gastroesophageal reflux disease)   . Hyperlipidemia   . Hypertension   . Internal hemorrhoids without mention of complication   . Osteoarthrosis, unspecified whether generalized or localized, unspecified site   . Osteoporosis, unspecified   . Restless legs syndrome (RLS)   . Unspecified prolapse of vaginal walls   . Unspecified vitamin D deficiency   . Urinary frequency     Past Surgical History:  Procedure Laterality Date  . ABDOMINAL HYSTERECTOMY  2011  . APPENDECTOMY  1972  . ESOPHAGEAL DILATION     Multiple times.  Eventually got botox injections and this helped a lot (as of 03/2016).  . ESOPHAGOGASTRODUODENOSCOPY (EGD) WITH PROPOFOL  05/17/2012   Procedure: ESOPHAGOGASTRODUODENOSCOPY (EGD) WITH PROPOFOL;  Surgeon: Garlan Fair, MD;  Location: [facility];  Service: Endoscopy;  Laterality: N/A;  . EYE SURGERY     catarct extraction bilateral  .  THYROID SURGERY  1954    History reviewed. No pertinent family history.  Social History   Socioeconomic History  . Marital status: Widowed    Spouse name: Not on file  . Number of children: Not on file  . Years of education: Not on file  . Highest education level: Not on file  Social Needs  . Financial resource strain: Not on file  . Food insecurity - worry: Not on file  . Food insecurity - inability: Not on file  . Transportation needs - medical: Not on file  . Transportation needs - non-medical: Not on file  Occupational History  . Not on file  Tobacco Use  . Smoking status: Never Smoker  . Smokeless tobacco: Never Used  Substance and Sexual Activity  . Alcohol use: No  . Drug use: No  . Sexual activity: Not on file  Other Topics Concern  . Not on file  Social History Narrative   Widow.   Lives in independent living facility since 01/2016.   Daughter looks after her.    Outpatient Medications Prior to Visit  Medication Sig Dispense Refill  . Cholecalciferol (VITAMIN D) 2000 UNITS CAPS Take 1 capsule (2,000 Units total) by mouth daily. 30 capsule 3  . citalopram (CELEXA) 40 MG tablet TAKE 1 TABLET BY MOUTH EVERY DAY 90 tablet 0  . clotrimazole-betamethasone (LOTRISONE) cream Apply 1 application topically 2 (two) times daily. 45 g 1  .  fluticasone (FLONASE) 50 MCG/ACT nasal spray INSTILL 2 SPRAYS IN EACH NOSTRIL EVERY DAY 16 g 1  . pantoprazole (PROTONIX) 40 MG tablet TAKE 1 TABLET BY MOUTH DAILY TO LOWER STOMACH ACID AND PROTECT ESOPHAGUS 90 tablet 0   No facility-administered medications prior to visit.     No Known Allergies  ROS Review of Systems  Constitutional: Negative for appetite change, chills, fatigue and fever.  HENT: Negative for congestion, dental problem, ear pain and sore throat.   Eyes: Negative for discharge, redness and visual disturbance.  Respiratory: Negative for cough, chest tightness, shortness of breath and wheezing.   Cardiovascular:  Negative for chest pain, palpitations and leg swelling.  Gastrointestinal: Negative for abdominal pain, blood in stool, diarrhea, nausea and vomiting.       Occ gas, esp after drinking mild (although it is lactose-free milk).   No severe pain.  No n/v.  Genitourinary: Negative for difficulty urinating, dysuria, flank pain, frequency, hematuria and urgency.  Musculoskeletal: Negative for arthralgias, back pain, joint swelling, myalgias and neck stiffness.  Skin: Negative for pallor and rash.  Neurological: Negative for dizziness, speech difficulty, weakness and headaches.  Hematological: Negative for adenopathy. Does not bruise/bleed easily.  Psychiatric/Behavioral: Positive for sleep disturbance (see hpi). Negative for confusion. The patient is not nervous/anxious.     PE;Initial bp today was 151/62---recheck manual bp was 130s/70s. Blood pressure (!) 151/62, pulse (!) 51, temperature (!) 97.4 F (36.3 C), temperature source Oral, resp. rate 16, height 5\' 5"  (1.651 m), weight 168 lb 8 oz (76.4 kg), SpO2 97 %. Gen: Alert, well appearing.  Patient is oriented to person, place, time, and situation. AFFECT: pleasant, lucid thought and speech. ENT: Ears: EACs clear, normal epithelium.  TMs with good light reflex and landmarks bilaterally.  Eyes: no injection, icteris, swelling, or exudate.  EOMI, PERRLA. Nose: no drainage or turbinate edema/swelling.  No injection or focal lesion.  Mouth: lips without lesion/swelling.  Oral mucosa pink and moist.  Dentition intact and without obvious caries or gingival swelling.  Oropharynx without erythema, exudate, or swelling.  Neck: supple/nontender.  No LAD, mass, or TM.  Carotid pulses 2+ bilaterally, without bruits. CV: RRR, no m/r/g.   LUNGS: CTA bilat, nonlabored resps, good aeration in all lung fields. ABD: soft, NT, ND, BS normal.  No hepatospenomegaly or mass.  No bruits. EXT: no clubbing, cyanosis, or edema.  Musculoskeletal: no joint swelling,  erythema, warmth, or tenderness.  ROM of all joints intact. Skin - no sores or suspicious lesions or rashes or color changes   Pertinent labs:  Lab Results  Component Value Date   TSH 2.040 11/02/2012   Lab Results  Component Value Date   WBC 3.9 (L) 01/08/2017   HGB 13.0 01/08/2017   HCT 39.6 01/08/2017   MCV 96.8 01/08/2017   PLT 179.0 01/08/2017   Lab Results  Component Value Date   CREATININE 0.71 08/23/2014   BUN 17 08/23/2014   NA 138 08/23/2014   K 4.4 08/23/2014   CL 98 08/23/2014   CO2 24 08/23/2014   Lab Results  Component Value Date   ALT 11 08/23/2013   AST 20 08/23/2013   ALKPHOS 79 08/23/2013   BILITOT 0.3 08/23/2013   Lab Results  Component Value Date   CHOL 189 05/08/2013   Lab Results  Component Value Date   HDL 54 05/08/2013   Lab Results  Component Value Date   LDLCALC 120 (H) 05/08/2013   Lab Results  Component Value Date  TRIG 77 05/08/2013   Lab Results  Component Value Date   CHOLHDL 3.5 05/08/2013    ASSESSMENT AND PLAN:   Health maintenance exam: Reviewed age and gender appropriate health maintenance issues (prudent diet, regular exercise, health risks of tobacco and excessive alcohol, use of seatbelts, fire alarms in home, use of sunscreen).  Also reviewed age and gender appropriate health screening as well as vaccine recommendations. Vaccines: Tdap, prevnar, flu, and shingrix vaccines all recommended today but pt declined them. Labs: hx of vit D def (currently on 2000 U qd supplement)--check vit D level today.  Also, check BMET (DM screening).  She declines any other labs. Cervical, breast, and colon cancer screening: no longer applicable to this patient.  For her insomnia, I think the safest/best option for her is to increase her otc melatonin to 10mg  qhs. For her gas, continue lactose-free dairy and try otc gas-ex prn.  An After Visit Summary was printed and given to the patient.  FOLLOW UP:  Return in about 1 year  (around 05/24/2018) for routine chronic illness f/u.  Signed:  Crissie Sickles, MD           05/24/2017

## 2017-05-24 NOTE — Patient Instructions (Signed)
Increase your melatonin to 10 mg every evening to help with sleep.

## 2017-07-08 ENCOUNTER — Other Ambulatory Visit: Payer: Self-pay | Admitting: Family Medicine

## 2017-07-08 DIAGNOSIS — K219 Gastro-esophageal reflux disease without esophagitis: Secondary | ICD-10-CM

## 2017-07-09 ENCOUNTER — Other Ambulatory Visit: Payer: Self-pay | Admitting: Family Medicine

## 2017-07-09 ENCOUNTER — Ambulatory Visit (INDEPENDENT_AMBULATORY_CARE_PROVIDER_SITE_OTHER): Payer: Medicare Other | Admitting: Family Medicine

## 2017-07-09 ENCOUNTER — Encounter: Payer: Self-pay | Admitting: Family Medicine

## 2017-07-09 VITALS — BP 113/68 | HR 54 | Resp 16 | Wt 166.0 lb

## 2017-07-09 DIAGNOSIS — L989 Disorder of the skin and subcutaneous tissue, unspecified: Secondary | ICD-10-CM | POA: Diagnosis not present

## 2017-07-09 NOTE — Progress Notes (Signed)
OFFICE VISIT  07/09/2017   CC:  Chief Complaint  Patient presents with  . Nevus    skin lesion on nose   HPI:    Patient is a 84 y.o.  female who presents accompanied by her daughter for skin lesion on nose.  Pink bump/papule on L side of nose noted first about 2-3 mo ago.  No itch or pain.  Neosporin and peroxide application no help. Appetite good, no f/c, no abnl weight loss.   Past Medical History:  Diagnosis Date  . Alzheimer's disease   . Anemia, unspecified   . Dyspepsia and other specified disorders of function of stomach   . Dysphagia, unspecified(787.20)   . Dysrhythmia    Bradycardia, 1st degree AV block.  Dr. Einar Gip considered doing a pacemaker but he and pt decided risk>benefit at that time  . Edema   . GAD (generalized anxiety disorder)   . GERD (gastroesophageal reflux disease)   . Hyperlipidemia   . Hypertension   . Internal hemorrhoids without mention of complication   . Osteoarthrosis, unspecified whether generalized or localized, unspecified site   . Osteoporosis, unspecified   . Restless legs syndrome (RLS)   . Unspecified prolapse of vaginal walls   . Unspecified vitamin D deficiency   . Urinary frequency     Past Surgical History:  Procedure Laterality Date  . ABDOMINAL HYSTERECTOMY  2011  . APPENDECTOMY  1972  . ESOPHAGEAL DILATION     Multiple times.  Eventually got botox injections and this helped a lot (as of 03/2016).  . ESOPHAGOGASTRODUODENOSCOPY (EGD) WITH PROPOFOL  05/17/2012   Procedure: ESOPHAGOGASTRODUODENOSCOPY (EGD) WITH PROPOFOL;  Surgeon: Garlan Fair, MD;  Location: [facility];  Service: Endoscopy;  Laterality: N/A;  . EYE SURGERY     catarct extraction bilateral  . THYROID SURGERY  1954    Outpatient Medications Prior to Visit  Medication Sig Dispense Refill  . Cholecalciferol (VITAMIN D) 2000 UNITS CAPS Take 1 capsule (2,000 Units total) by mouth daily. 30 capsule 3  . citalopram (CELEXA) 40 MG tablet TAKE 1 TABLET BY  MOUTH EVERY DAY 90 tablet 0  . fluticasone (FLONASE) 50 MCG/ACT nasal spray INSTILL 2 SPRAYS IN EACH NOSTRIL EVERY DAY 16 g 1  . pantoprazole (PROTONIX) 40 MG tablet TAKE 1 TABLET BY MOUTH DAILY TO LOWER STOMACH ACID AND PROTECT ESOPHAGUS 90 tablet 1  . clotrimazole-betamethasone (LOTRISONE) cream Apply 1 application topically 2 (two) times daily. (Patient not taking: Reported on 07/09/2017) 45 g 1   No facility-administered medications prior to visit.     No Known Allergies  ROS As per HPI  PE: Blood pressure 113/68, pulse (!) 54, resp. rate 16, weight 166 lb (75.3 kg), SpO2 96 %. Gen: Alert, well appearing.  Patient is oriented to person, place, time, and situation. AFFECT: pleasant, lucid thought and speech. Nose: L nasal ala with approx 3 mm diameter round papule with distinct borders but borders not perfectly regular. Surface telangectasia noted.  No verruca features.  No tenderness.  Similar non-raised, punctate spot on distal bridge of nose.  LABS:    Chemistry      Component Value Date/Time   NA 138 05/24/2017 1424   NA 138 08/23/2014 1355   K 4.1 05/24/2017 1424   CL 104 05/24/2017 1424   CO2 28 05/24/2017 1424   BUN 16 05/24/2017 1424   BUN 17 08/23/2014 1355   CREATININE 0.75 05/24/2017 1424      Component Value Date/Time   CALCIUM  9.1 05/24/2017 1424   ALKPHOS 79 08/23/2013 1453   AST 20 08/23/2013 1453   ALT 11 08/23/2013 1453   BILITOT 0.3 08/23/2013 1453       IMPRESSION AND PLAN:  Skin lesion of nose: need to r/o SCC/BCC, plus patient wants it removed b/c she is bothered by it and doesn't like the way it looks cosmetically. Since I do not attempt skin procedures on the face, I'll refer patient to dermatologist for further evaluation. Pt and daughter in agreement with plan.  An After Visit Summary was printed and given to the patient.  FOLLOW UP: Return for as needed.  Signed:  Crissie Sickles, MD           07/09/2017

## 2017-08-02 ENCOUNTER — Other Ambulatory Visit: Payer: Self-pay | Admitting: Dermatology

## 2017-08-02 DIAGNOSIS — C44321 Squamous cell carcinoma of skin of nose: Secondary | ICD-10-CM | POA: Diagnosis not present

## 2017-08-02 DIAGNOSIS — L669 Cicatricial alopecia, unspecified: Secondary | ICD-10-CM | POA: Diagnosis not present

## 2017-08-11 ENCOUNTER — Encounter: Payer: Self-pay | Admitting: Family Medicine

## 2017-09-09 DIAGNOSIS — C44321 Squamous cell carcinoma of skin of nose: Secondary | ICD-10-CM | POA: Diagnosis not present

## 2018-02-09 ENCOUNTER — Ambulatory Visit: Payer: Self-pay | Admitting: *Deleted

## 2018-02-09 NOTE — Telephone Encounter (Signed)
No triage performed. The patient's daughter, Arville Go phoned for an appointment for her stating she has been slurring her words by the end of her sentences, for approximately one month now. The patient lives in a senior apartment community. The daughter visited with her yesterday she reports. She requested an appointment for later this week. Appointment made for tomorrow with PCP.

## 2018-02-10 ENCOUNTER — Encounter: Payer: Self-pay | Admitting: Family Medicine

## 2018-02-10 ENCOUNTER — Other Ambulatory Visit: Payer: Self-pay

## 2018-02-10 ENCOUNTER — Ambulatory Visit (HOSPITAL_BASED_OUTPATIENT_CLINIC_OR_DEPARTMENT_OTHER)
Admission: RE | Admit: 2018-02-10 | Discharge: 2018-02-10 | Disposition: A | Discharge status: Home / Self Care | Payer: Medicare Other | Source: Ambulatory Visit | Attending: Family Medicine | Admitting: Family Medicine

## 2018-02-10 ENCOUNTER — Ambulatory Visit: Payer: Self-pay | Admitting: Family Medicine

## 2018-02-10 ENCOUNTER — Encounter (HOSPITAL_BASED_OUTPATIENT_CLINIC_OR_DEPARTMENT_OTHER): Payer: Self-pay | Admitting: Emergency Medicine

## 2018-02-10 ENCOUNTER — Emergency Department (HOSPITAL_BASED_OUTPATIENT_CLINIC_OR_DEPARTMENT_OTHER): Payer: Medicare Other

## 2018-02-10 ENCOUNTER — Emergency Department (HOSPITAL_BASED_OUTPATIENT_CLINIC_OR_DEPARTMENT_OTHER)
Admission: EM | Admit: 2018-02-10 | Discharge: 2018-02-10 | Disposition: A | Discharge status: Home / Self Care | Payer: Medicare Other | Attending: Emergency Medicine | Admitting: Emergency Medicine

## 2018-02-10 ENCOUNTER — Ambulatory Visit (INDEPENDENT_AMBULATORY_CARE_PROVIDER_SITE_OTHER): Payer: Medicare Other | Admitting: Family Medicine

## 2018-02-10 VITALS — BP 105/68 | HR 58 | Temp 97.9°F | Resp 24 | Ht 65.0 in | Wt 157.6 lb

## 2018-02-10 DIAGNOSIS — R0602 Shortness of breath: Secondary | ICD-10-CM

## 2018-02-10 DIAGNOSIS — R29810 Facial weakness: Secondary | ICD-10-CM | POA: Diagnosis not present

## 2018-02-10 DIAGNOSIS — G309 Alzheimer's disease, unspecified: Secondary | ICD-10-CM | POA: Insufficient documentation

## 2018-02-10 DIAGNOSIS — F0280 Dementia in other diseases classified elsewhere without behavioral disturbance: Secondary | ICD-10-CM | POA: Diagnosis not present

## 2018-02-10 DIAGNOSIS — M7989 Other specified soft tissue disorders: Secondary | ICD-10-CM | POA: Diagnosis not present

## 2018-02-10 DIAGNOSIS — R29818 Other symptoms and signs involving the nervous system: Secondary | ICD-10-CM

## 2018-02-10 DIAGNOSIS — R4701 Aphasia: Secondary | ICD-10-CM

## 2018-02-10 DIAGNOSIS — Z79899 Other long term (current) drug therapy: Secondary | ICD-10-CM | POA: Insufficient documentation

## 2018-02-10 DIAGNOSIS — M25471 Effusion, right ankle: Secondary | ICD-10-CM | POA: Diagnosis not present

## 2018-02-10 DIAGNOSIS — R2241 Localized swelling, mass and lump, right lower limb: Secondary | ICD-10-CM | POA: Diagnosis present

## 2018-02-10 DIAGNOSIS — I824Z1 Acute embolism and thrombosis of unspecified deep veins of right distal lower extremity: Secondary | ICD-10-CM | POA: Diagnosis not present

## 2018-02-10 DIAGNOSIS — I82439 Acute embolism and thrombosis of unspecified popliteal vein: Secondary | ICD-10-CM

## 2018-02-10 DIAGNOSIS — I10 Essential (primary) hypertension: Secondary | ICD-10-CM | POA: Diagnosis not present

## 2018-02-10 DIAGNOSIS — F419 Anxiety disorder, unspecified: Secondary | ICD-10-CM | POA: Diagnosis not present

## 2018-02-10 DIAGNOSIS — Z85828 Personal history of other malignant neoplasm of skin: Secondary | ICD-10-CM | POA: Diagnosis not present

## 2018-02-10 DIAGNOSIS — R479 Unspecified speech disturbances: Secondary | ICD-10-CM | POA: Insufficient documentation

## 2018-02-10 DIAGNOSIS — I82431 Acute embolism and thrombosis of right popliteal vein: Secondary | ICD-10-CM | POA: Diagnosis not present

## 2018-02-10 DIAGNOSIS — G308 Other Alzheimer's disease: Secondary | ICD-10-CM

## 2018-02-10 HISTORY — DX: Acute embolism and thrombosis of unspecified popliteal vein: I82.439

## 2018-02-10 LAB — BASIC METABOLIC PANEL
BUN: 12 mg/dL (ref 6–23)
CALCIUM: 9.3 mg/dL (ref 8.4–10.5)
CO2: 30 meq/L (ref 19–32)
CREATININE: 0.79 mg/dL (ref 0.40–1.20)
Chloride: 106 mEq/L (ref 96–112)
GFR: 71.69 mL/min (ref >60.00)
GLUCOSE: 114 mg/dL — AB (ref 70–99)
Potassium: 3.8 mEq/L (ref 3.5–5.1)
Sodium: 140 mEq/L (ref 135–145)

## 2018-02-10 LAB — CBC WITH DIFFERENTIAL/PLATELET
Abs Immature Granulocytes: 0.02 10*3/uL (ref 0.00–0.07)
Basophils Absolute: 0 10*3/uL (ref 0.0–0.1)
Basophils Absolute: 0 10*3/uL (ref 0.0–0.1)
Basophils Relative: 0.2 % (ref 0.0–3.0)
Basophils Relative: 0 %
EOS ABS: 0.1 10*3/uL (ref 0.0–0.5)
EOS PCT: 0.3 % (ref 0.0–5.0)
EOS PCT: 1 %
Eosinophils Absolute: 0 10*3/uL (ref 0.0–0.7)
HCT: 40.2 % (ref 36.0–46.0)
HEMATOCRIT: 40.8 % (ref 36.0–46.0)
HEMOGLOBIN: 13.6 g/dL (ref 12.0–15.0)
Hemoglobin: 13.1 g/dL (ref 12.0–15.0)
Immature Granulocytes: 0 %
LYMPHS PCT: 8.8 % — AB (ref 12.0–46.0)
Lymphocytes Relative: 16 %
Lymphs Abs: 0.8 10*3/uL (ref 0.7–4.0)
Lymphs Abs: 1.2 10*3/uL (ref 0.7–4.0)
MCH: 32.2 pg (ref 26.0–34.0)
MCHC: 33.3 g/dL (ref 30.0–36.0)
MCHC: 32.6 g/dL (ref 30.0–36.0)
MCV: 97.2 fl (ref 78.0–100.0)
MCV: 98.8 fL (ref 80.0–100.0)
MONO ABS: 0.6 10*3/uL (ref 0.1–1.0)
MONOS PCT: 7 % (ref 3.0–12.0)
MONOS PCT: 9 %
Monocytes Absolute: 0.6 10*3/uL (ref 0.1–1.0)
Neutro Abs: 7.5 10*3/uL (ref 1.4–7.7)
Neutro Abs: 5.4 10*3/uL (ref 1.7–7.7)
Neutrophils Relative %: 83.7 % — ABNORMAL HIGH (ref 43.0–77.0)
Neutrophils Relative %: 74 %
Platelets: 187 10*3/uL (ref 150.0–400.0)
Platelets: 164 10*3/uL (ref 150–400)
RBC: 4.2 Mil/uL (ref 3.87–5.11)
RBC: 4.07 MIL/uL (ref 3.87–5.11)
RDW: 12.9 % (ref 11.5–15.5)
RDW: 12.2 % (ref 11.5–15.5)
WBC: 9 10*3/uL (ref 4.0–10.5)
WBC: 7.3 10*3/uL (ref 4.0–10.5)
nRBC: 0 % (ref 0.0–0.2)

## 2018-02-10 LAB — COMPREHENSIVE METABOLIC PANEL
ALT: 10 U/L (ref 0–44)
AST: 16 U/L (ref 15–41)
Albumin: 3.3 g/dL — ABNORMAL LOW (ref 3.5–5.0)
Alkaline Phosphatase: 61 U/L (ref 38–126)
Anion gap: 5 (ref 5–15)
BILIRUBIN TOTAL: 0.4 mg/dL (ref 0.3–1.2)
BUN: 13 mg/dL (ref 8–23)
CO2: 26 mmol/L (ref 22–32)
Calcium: 9.3 mg/dL (ref 8.9–10.3)
Chloride: 106 mmol/L (ref 98–111)
Creatinine, Ser: 0.82 mg/dL (ref 0.44–1.00)
GFR calc Af Amer: >60 mL/min (ref >60)
GFR, EST NON AFRICAN AMERICAN: 59 mL/min — AB (ref >60)
Glucose, Bld: 104 mg/dL — ABNORMAL HIGH (ref 70–99)
POTASSIUM: 3.9 mmol/L (ref 3.5–5.1)
Sodium: 137 mmol/L (ref 135–145)
TOTAL PROTEIN: 6.8 g/dL (ref 6.5–8.1)

## 2018-02-10 LAB — PROTIME-INR
INR: 1.02
Prothrombin Time: 13.3 seconds (ref 11.4–15.2)

## 2018-02-10 LAB — TROPONIN I

## 2018-02-10 LAB — BRAIN NATRIURETIC PEPTIDE: B Natriuretic Peptide: 151.1 pg/mL — ABNORMAL HIGH (ref 0.0–100.0)

## 2018-02-10 MED ORDER — ASPIRIN EC 81 MG PO TBEC: 81.0000 mg | DELAYED_RELEASE_TABLET | Freq: Every day | ORAL | 0 refills | Status: DC

## 2018-02-10 NOTE — Progress Notes (Signed)
OFFICE VISIT  02/10/2018   CC:  Chief Complaint  Patient presents with  . Aphasia    x 2 months    HPI:    Patient is a 84 y.o. Caucasian female with alzheimer's dz who presents for some speech abnormalities that her daughter has noticed.  She has been having word searching difficulties much more over the last 6 wks or so, also some periods of nonsensical speech.  Seems to follow commands and demonstrate understanding of others but she is so [facility] that this is sometimes hard to tell.  Daughter notes onset of swelling in right ankle and foot the last few days or so.  Pt denies pain in the leg.  Pt has had increased RR/some mildly labored resp's at times, sounds like possibly going on >2 wks, but daughter was not real sure about timing of this symptom.  No cough or fevers or wheezing.  She has been walking slower than her usual, but no veering and no falls. She ambulates with a cane.  She continues to have very significant short term memory impairment and some limitations in ADLs.  Lives in NH, daughter and sons visit her lots.  Upon walking in the room today I immediately noted facial asymmetry, but apparently the daughter had not picked up on this.  No trouble with slurring of speech, swallowing, or focal weakness or any c/o sensory changes.  No known vision or hearing changes of late. No known history of CVA No neuroimaging documented in this EMR. She does not take an aspirin.  Past Medical History:  Diagnosis Date  . Alzheimer's disease ([place])   . Anemia, unspecified   . Dyspepsia and other specified disorders of function of stomach   . Dysphagia, unspecified(787.20)   . Dysrhythmia    Bradycardia, 1st degree AV block.  Dr. Einar Gip considered doing a pacemaker but he and pt decided risk>benefit at that time  . Edema   . GAD (generalized anxiety disorder)   . GERD (gastroesophageal reflux disease)   . Hyperlipidemia   . Hypertension   . Internal hemorrhoids without mention of  complication   . Nonmelanoma skin cancer    SCC  L side of nose (Dr. Denna Haggard).  . Osteoarthrosis, unspecified whether generalized or localized, unspecified site   . Osteoporosis, unspecified   . Restless legs syndrome (RLS)   . Unspecified prolapse of vaginal walls   . Unspecified vitamin D deficiency   . Urinary frequency     Past Surgical History:  Procedure Laterality Date  . ABDOMINAL HYSTERECTOMY  2011  . APPENDECTOMY  1972  . ESOPHAGEAL DILATION     Multiple times.  Eventually got botox injections and this helped a lot (as of 03/2016).  . ESOPHAGOGASTRODUODENOSCOPY (EGD) WITH PROPOFOL  05/17/2012   Procedure: ESOPHAGOGASTRODUODENOSCOPY (EGD) WITH PROPOFOL;  Surgeon: Garlan Fair, MD;  Location: [facility];  Service: Endoscopy;  Laterality: N/A;  . EYE SURGERY     catarct extraction bilateral  . THYROID SURGERY  1954    Outpatient Medications Prior to Visit  Medication Sig Dispense Refill  . Cholecalciferol (VITAMIN D) 2000 UNITS CAPS Take 1 capsule (2,000 Units total) by mouth daily. 30 capsule 3  . citalopram (CELEXA) 40 MG tablet TAKE 1 TABLET BY MOUTH EVERY DAY 90 tablet 1  . clotrimazole-betamethasone (LOTRISONE) cream Apply 1 application topically 2 (two) times daily. 45 g 1  . fluticasone (FLONASE) 50 MCG/ACT nasal spray INSTILL 2 SPRAYS IN EACH NOSTRIL EVERY DAY 16 g 1  .  pantoprazole (PROTONIX) 40 MG tablet TAKE 1 TABLET BY MOUTH DAILY TO LOWER STOMACH ACID AND PROTECT ESOPHAGUS 90 tablet 1   No facility-administered medications prior to visit.     No Known Allergies  ROS As per HPI  PE: Blood pressure 105/68, pulse (!) 58, temperature 97.9 F (36.6 C), resp. rate (!) 24, height 5\' 5"  (1.651 m), weight 157 lb 9.6 oz (71.5 kg), SpO2 93 %. Gen: alert, very HOH, follows commands but usually requires lots of repeating and redirecting. She attends well.  She is oriented to person and general situation only. She can speak in short sentences fine, answers  questions usually with head nod or shake, but occ with yes or no. No slurring of speech. FUX:NATF: no injection, icteris, swelling, or exudate.  EOMI, PERRLA. Mouth: lips without lesion/swelling.  Oral mucosa pink and moist. Oropharynx without erythema, exudate, or swelling.  R sided facial muscles weak--upper and lower.  Tongue protrudes in midline.  Muscles of mastication contract normally.   All other cranial nerve testing normal.  UE and LE strength 5/5 and symmetric.  UE and LE DTRs 2+ and symmetric except achilles DTRs trace bilat.  Pronator drift testing: neg on L arm, equivocal on R arm.  No tremor.  She can ambulate w/out veering or stumbling or shuffling, makes a fairly normal turnaround--walks slowly, has some mild difficulty getting herself up onto exam table.   Neck: no carotid bruit. CV: RRR, distant S1 and S2, without m/r/g. Chest is clear, no wheezing or rales. Normal symmetric air entry throughout both lung fields. No chest wall deformities or tenderness.  She pants at times and then can slow down and breath regularly at times.  No retractions or nasal flaring. No clubbing or cyanosis. She as No pitting edema on L LL. She has 1+ pitting edema on top of R foot, in R ankle and trace pitting in pretibial region of RLL.  No cord, mass, or tenderness or erythema of R LL.    ROS: no CP, no wheezing, no cough, no dizziness, no HAs, no rashes, no melena/hematochezia.  No polyuria or polydipsia.  No myalgias or arthralgias.     LABS:    Chemistry      Component Value Date/Time   NA 137 02/10/2018 1844   NA 138 08/23/2014 1355   K 3.9 02/10/2018 1844   CL 106 02/10/2018 1844   CO2 26 02/10/2018 1844   BUN 13 02/10/2018 1844   BUN 17 08/23/2014 1355   CREATININE 0.82 02/10/2018 1844      Component Value Date/Time   CALCIUM 9.3 02/10/2018 1844   ALKPHOS 61 02/10/2018 1844   AST 16 02/10/2018 1844   ALT 10 02/10/2018 1844   BILITOT 0.4 02/10/2018 1844       IMPRESSION AND  PLAN:  1) Speech/word finding struggles of late: could be part of dementia progression but also could be symptom of CVA. She has definite 7th CN palsy on R.   CVA vs Bells palsy. Plan CBC, CMET, MRI brain w/out contrast.  Discussed possibility of getting carotid dopplers +/- echocardiogram +/- Holter if CVA detected to explain her R sided facial weakness. Start ASA 81 mg qd.  2) R lower leg swelling x a few days: obtain LE venous doppler u/s to r/o DVT.  If + for DVT, we'll need to get her a CT chest angio to check for PE since she has been having periods of unexplained SOB lately. If leg u/s shows no  DVT, will simply stick with plain films of chest as imaging choice. LE venous doppler u/s scheduled for 5: 30 [facility].  Spent 50 min with pt today, with >50% of this time spent in counseling and care coordination regarding the above problems.  An After Visit Summary was printed and given to the patient.  FOLLOW UP: Return in about 2 weeks (around 02/24/2018) for f/u neurologic deficits.  Signed:  Crissie Sickles, MD           02/10/2018

## 2018-02-10 NOTE — ED Notes (Signed)
ED Provider at bedside. 

## 2018-02-10 NOTE — Telephone Encounter (Signed)
OK to see today

## 2018-02-10 NOTE — ED Provider Notes (Signed)
[facility] EMERGENCY DEPARTMENT Provider Note   CSN: [id number] Arrival date & time: 02/10/18  1817     History   Chief Complaint Chief Complaint  Patient presents with  . DVT    HPI Cynthia Shepard is a 84 y.o. female.  HPI   84 year old female with a history of Alzheimer's, hypertension, hyperlipidemia, presents with concern for DVT which was diagnosed on ultrasound today by primary care physician after they had presented with 1 month of speech problems and days of right foot swelling.  Daughter reports that over the last month, she is noted she will begin a sentence, and then halfway through, will stop, as if she is forgetting what she wants to say, are unable to say what she desires.  They deny any trauma, headaches, vomiting.  Denies numbness, weakness, difficulty walking, visual changes.  Denies chest pain, shortness of breath.  Patient's primary care physician had called the emergency department, and had reported concern for possible PE for which she wanted to have inpatient evaluation.  Daughter reports that they were sent due to her diagnosis of DVT.  Past Medical History:  Diagnosis Date  . Alzheimer's disease ([place])   . Anemia, unspecified   . DVT of popliteal vein ([place]) 02/10/2018   Acute:  Right popliteal vein, posterior tibial vein, and gastrocnemius veins.  . Dyspepsia and other specified disorders of function of stomach   . Dysphagia, unspecified(787.20)   . Dysrhythmia    Bradycardia, 1st degree AV block.  Dr. Einar Gip considered doing a pacemaker but he and pt decided risk>benefit at that time  . Edema   . GAD (generalized anxiety disorder)   . GERD (gastroesophageal reflux disease)   . Hyperlipidemia   . Hypertension   . Internal hemorrhoids without mention of complication   . Nonmelanoma skin cancer    SCC  L side of nose (Dr. Denna Haggard).  . Osteoarthrosis, unspecified whether generalized or localized, unspecified site   . Osteoporosis,  unspecified   . Restless legs syndrome (RLS)   . Unspecified prolapse of vaginal walls   . Unspecified vitamin D deficiency   . Urinary frequency     Patient Active Problem List   Diagnosis Date Noted  . Aspiration pneumonia due to regurgitated food ([place]) 02/12/2014  . Esophageal stricture 11/23/2013  . GERD (gastroesophageal reflux disease) 05/25/2013  . Osteopenia 05/25/2013  . Memory loss 05/25/2013  . Anxiety state   . Achalasia 09[birth year]04/1942    Past Surgical History:  Procedure Laterality Date  . ABDOMINAL HYSTERECTOMY  2011  . APPENDECTOMY  1972  . ESOPHAGEAL DILATION     Multiple times.  Eventually got botox injections and this helped a lot (as of 03/2016).  . ESOPHAGOGASTRODUODENOSCOPY (EGD) WITH PROPOFOL  05/17/2012   Procedure: ESOPHAGOGASTRODUODENOSCOPY (EGD) WITH PROPOFOL;  Surgeon: Garlan Fair, MD;  Location: [facility];  Service: Endoscopy;  Laterality: N/A;  . EYE SURGERY     catarct extraction bilateral  . THYROID SURGERY  1954     OB History   None      Home Medications    Prior to Admission medications   Medication Sig Start Date End Date Taking? Authorizing Provider  aspirin EC 81 MG tablet Take 1 tablet (81 mg total) by mouth daily. 02/10/18   McGowen, Adrian Blackwater, MD  Cholecalciferol (VITAMIN D) 2000 UNITS CAPS Take 1 capsule (2,000 Units total) by mouth daily. 11/22/14   Reed, Tiffany L, DO  citalopram (CELEXA) 40 MG tablet TAKE 1  TABLET BY MOUTH EVERY DAY 07/09/17   McGowen, Adrian Blackwater, MD  clotrimazole-betamethasone (LOTRISONE) cream Apply 1 application topically 2 (two) times daily. 03/09/16   McGowen, Adrian Blackwater, MD  fluticasone (FLONASE) 50 MCG/ACT nasal spray INSTILL 2 SPRAYS IN EACH NOSTRIL EVERY DAY 04/08/17   McGowen, Adrian Blackwater, MD  pantoprazole (PROTONIX) 40 MG tablet TAKE 1 TABLET BY MOUTH DAILY TO LOWER STOMACH ACID AND PROTECT ESOPHAGUS 07/08/17   McGowen, Adrian Blackwater, MD    Family History No family history on file.  Social History Social  History   Tobacco Use  . Smoking status: Never Smoker  . Smokeless tobacco: Never Used  Substance Use Topics  . Alcohol use: No  . Drug use: No     Allergies   Patient has no known allergies.   Review of Systems Review of Systems  Unable to perform ROS: Dementia  Constitutional: Negative for fever.  Eyes: Negative for visual disturbance.  Respiratory: Negative for cough and shortness of breath.   Cardiovascular: Positive for leg swelling. Negative for chest pain.  Gastrointestinal: Negative for nausea and vomiting.  Genitourinary: Negative for difficulty urinating and dysuria.  Skin: Negative for rash.  Neurological: Negative for syncope, facial asymmetry, weakness, light-headedness, numbness and headaches.     Physical Exam Updated Vital Signs BP (!) 151/59 (BP Location: Right Arm)   Pulse (!) 53   Resp 20   Ht 5\' 5"  (1.651 m)   Wt 71 kg   SpO2 97%   BMI 26.05 kg/m   Physical Exam  Constitutional: She appears well-developed and well-nourished. No distress.  HENT:  Head: Normocephalic and atraumatic.  Eyes: Conjunctivae and EOM are normal.  Neck: Normal range of motion.  Cardiovascular: Normal rate, regular rhythm, normal heart sounds and intact distal pulses. Exam reveals no gallop and no friction rub.  No murmur heard. Pulmonary/Chest: Effort normal and breath sounds normal. No respiratory distress. She has no wheezes. She has no rales.  Abdominal: Soft. She exhibits no distension. There is no tenderness. There is no guarding.  Musculoskeletal: She exhibits no edema or tenderness.  Right lower leg swelling  Neurological: She is alert. She has normal strength. No cranial nerve deficit or sensory deficit. Gait normal. GCS eye subscore is 4. GCS verbal subscore is 5. GCS motor subscore is 6.  Skin: Skin is warm and dry. No rash noted. She is not diaphoretic. No erythema.  Nursing note and vitals reviewed.    ED Treatments / Results  Labs (all labs ordered  are listed, but only abnormal results are displayed) Labs Reviewed  COMPREHENSIVE METABOLIC PANEL - Abnormal; Notable for the following components:      Result Value   Glucose, Bld 104 (*)    Albumin 3.3 (*)    GFR calc non Af Amer 59 (*)    All other components within normal limits  BRAIN NATRIURETIC PEPTIDE - Abnormal; Notable for the following components:   B Natriuretic Peptide 151.1 (*)    All other components within normal limits  CBC WITH DIFFERENTIAL/PLATELET  TROPONIN I  PROTIME-INR    EKG None  Radiology US Venous Img Lower Unilateral Right  Result Date: 02/10/2018 CLINICAL DATA:  Right lower extremity swelling EXAM: RIGHT LOWER EXTREMITY VENOUS DOPPLER ULTRASOUND TECHNIQUE: Gray-scale sonography with graded compression, as well as color Doppler and duplex ultrasound were performed to evaluate the lower extremity deep venous systems from the level of the common femoral vein and including the common femoral, femoral, profunda femoral, popliteal and  calf veins including the posterior tibial, peroneal and gastrocnemius veins when visible. The superficial great saphenous vein was also interrogated. Spectral Doppler was utilized to evaluate flow at rest and with distal augmentation maneuvers in the common femoral, femoral and popliteal veins. COMPARISON:  None. FINDINGS: Contralateral Common Femoral Vein: Respiratory phasicity is normal and symmetric with the symptomatic side. No evidence of thrombus. Normal compressibility. Common Femoral Vein: No evidence of thrombus. Normal compressibility, respiratory phasicity and response to augmentation. Saphenofemoral Junction: No evidence of thrombus. Normal compressibility and flow on color Doppler imaging. Profunda Femoral Vein: No evidence of thrombus. Normal compressibility and flow on color Doppler imaging. Femoral Vein: No evidence of thrombus. Normal compressibility, respiratory phasicity and response to augmentation. Popliteal Vein:  Occlusive thrombus in the right popliteal vein which is noncompressible. Calf Veins: Occlusive thrombus extends into the posterior tibial vein. Clot noted in the gastrocnemius vein. Superficial Great Saphenous Vein: No evidence of thrombus. Normal compressibility. Venous Reflux:  None. Other Findings:  None. IMPRESSION: Occlusive thrombus within the right popliteal vein and extending into the posterior tibial and gastrocnemius veins in the right calf. Electronically Signed   By: Rolm Baptise M.D.   On: 02/10/2018 18:30    Procedures Procedures (including critical care time)  Medications Ordered in ED Medications - No data to display   Initial Impression / Assessment and Plan / ED Course  I have reviewed the triage vital signs and the nursing notes.  Pertinent labs & imaging results that were available during my care of the patient were reviewed by me and considered in my medical decision making (see chart for details).     84 year old female with a history of Alzheimer's, hypertension, hyperlipidemia, presents with concern for DVT which was diagnosed on ultrasound today by primary care physician after they had presented with 1 month of speech problems and days of right foot swelling.  Patient without chest pain or shortness of breath.  CT PE study was ordered initially in triage given outpatient providers request for this, however after after further evaluation, have low suspicion for this diagnosis.  Labs obtained showed no significant abnormalities.  Discussed with daughter that treatment for DVT would include anticoagulation, and recommend head CT given neurologic symptoms expressed--- particularly in the setting of patient being not a reliable historian, and possibility of fall, with possible intracranial bleed as etiology of her change in status over the last month--and that initiating anticoagulation in absence of head CT has risks.  Patient does not have signs of trauma on exam, has  nonfocal neuro exam. Possible speech problems represent worsening dementia, but discussed stroke one month ago and intracranial bleed on differential.  Patient reports she would like to leave the ED.  Daughter reports she does not want to force her to do CT scan, and does not want to empirically give anticoagulation understanding the risks of both initiation of medication as well as risks of not treating DVT.  Daughter reports understanding that DVT untreated may lead to PE and possible death, however they would like to focus on comfort for patient at this time. She will start aspirin and follow up with Dr. Anitra Lauth.    Final Clinical Impressions(s) / ED Diagnoses   Final diagnoses:  Acute deep vein thrombosis (DVT) of distal vein of right lower extremity [facility])    ED Discharge Orders    None       Gareth Morgan, MD 02/11/18 [id number]

## 2018-02-10 NOTE — Telephone Encounter (Signed)
Spoke with Arville Go, patient's daughter this morning. No acute symptoms reported.  For approximately one month now, she has noticed Cynthia Shepard will forget the last few words of some of her sentences, then she gets frustrated and begins to slur the rest of her sentence. She has no difficulty swallowing/eating. No change or increase in weakness other than age. She walks without any difficulty using a cane at times. Appointment today with Dr. Anitra Lauth.

## 2018-02-10 NOTE — Patient Instructions (Signed)
Start one 81mg  Aspirin once a day.

## 2018-02-10 NOTE — ED Triage Notes (Addendum)
Had a U/S today and has a DVT of right leg

## 2018-02-10 NOTE — Telephone Encounter (Signed)
Patients daughter returning call from [employer].  Obtained CRM to relate message. See note from  Kem Parkinson.

## 2018-02-10 NOTE — ED Notes (Signed)
Pt removed gown, cardiac monitor leads, pulse ox and BP cuff and was trying to get OOB. Pt's family member had stepped away for a few minutes. Pt was reoriented and repositioned in bed; family mbr now at bedside. Pt is anxious and wants to leave.

## 2018-02-11 ENCOUNTER — Telehealth: Payer: Self-pay | Admitting: Family Medicine

## 2018-02-11 NOTE — Telephone Encounter (Signed)
Spoke with pt's daughter, Mechele Claude, about plan. I recommended CT head to r/o and bleeding (pt poor historian, could have fallen and hit head 1 mo ago prior to onset of her speech/language changes).  If not bleeding, would start DOAC and treat for a duration of 3 mo. We will not do any CT of chest to eval for PE---her SOB is more likely psychosomatic than pulmonary in origin, suspicion of PE low. I called med center [facility] and asked if they do outpt CT's on weekend and they do not. Therefore, the plan is to get a noncontrast CT head on Monday 02/14/18. Additionally, MRI brain will not be done.  After further thought and review of my visit with her yesterday, I think she has Bell's palsy---low suspicion of CVA or mass.  Signed:  Crissie Sickles, MD           02/11/2018

## 2018-02-11 NOTE — Telephone Encounter (Deleted)
Patient can be scheduled today at noon or tomorrow at [facility] if order is entered as stat with call report. I called Mechele Claude. She can get patient there.

## 2018-02-11 NOTE — Telephone Encounter (Signed)
Copied from [place] [id number]. Topic: Quick Communication - See Telephone Encounter >> Feb 11, 2018  7:34 AM Gardiner Ramus wrote: CRM for notification. See Telephone encounter for: 02/11/18. Pt daughter called and stated that pt needed to get CT scan done. Patient become very agitated because it was such a long wait. Pt daughter would like to know if some place else woul be faster so that she does not get agitated. Please advise Cb#[phone]

## 2018-02-14 ENCOUNTER — Other Ambulatory Visit: Payer: Self-pay | Admitting: Family Medicine

## 2018-02-14 DIAGNOSIS — R29818 Other symptoms and signs involving the nervous system: Secondary | ICD-10-CM

## 2018-02-14 DIAGNOSIS — R4702 Dysphasia: Secondary | ICD-10-CM

## 2018-02-16 ENCOUNTER — Ambulatory Visit
Admission: RE | Admit: 2018-02-16 | Discharge: 2018-02-16 | Disposition: A | Discharge status: Home / Self Care | Payer: Medicare Other | Source: Ambulatory Visit | Attending: Family Medicine | Admitting: Family Medicine

## 2018-02-16 ENCOUNTER — Other Ambulatory Visit: Payer: Self-pay | Admitting: *Deleted

## 2018-02-16 ENCOUNTER — Ambulatory Visit (HOSPITAL_COMMUNITY): Admission: RE | Admit: 2018-02-16 | Payer: Medicare Other | Source: Ambulatory Visit

## 2018-02-16 ENCOUNTER — Other Ambulatory Visit: Payer: Self-pay | Admitting: Family Medicine

## 2018-02-16 ENCOUNTER — Encounter: Payer: Self-pay | Admitting: Family Medicine

## 2018-02-16 ENCOUNTER — Telehealth: Payer: Self-pay | Admitting: Family Medicine

## 2018-02-16 DIAGNOSIS — R29818 Other symptoms and signs involving the nervous system: Secondary | ICD-10-CM

## 2018-02-16 DIAGNOSIS — R4702 Dysphasia: Secondary | ICD-10-CM

## 2018-02-16 DIAGNOSIS — G935 Compression of brain: Secondary | ICD-10-CM

## 2018-02-16 DIAGNOSIS — D329 Benign neoplasm of meninges, unspecified: Secondary | ICD-10-CM | POA: Diagnosis not present

## 2018-02-16 DIAGNOSIS — R930 Abnormal findings on diagnostic imaging of skull and head, not elsewhere classified: Secondary | ICD-10-CM

## 2018-02-16 DIAGNOSIS — D496 Neoplasm of unspecified behavior of brain: Secondary | ICD-10-CM

## 2018-02-16 HISTORY — DX: Compression of brain: G93.5

## 2018-02-16 HISTORY — DX: Neoplasm of unspecified behavior of brain: D49.6

## 2018-02-16 IMAGING — CT CT HEAD WO/W CM
3 of 4 series · 15 of 47 positions shown, 18 images · IV contrast (iopamidol)
Comparison: No prior cross-sectional imaging of the brain.

CLINICAL DATA: Difficulty with speech.  Headache and confusion.

EXAM:
CT HEAD WITHOUT AND WITH CONTRAST
TECHNIQUE: Contiguous axial images were obtained from the base of the skull
through the vertex without and with intravenous contrast
CONTRAST:  75mL 3ILMB3-MHH IOPAMIDOL (3ILMB3-MHH) INJECTION 61%

[Series 2: head 5.00 hr40 s3 ibhc · axial · 0.41mm/px · z∈[-549,-414]mm · 9 of 33 slices shown, 12 images]
[im 3/33  brain]
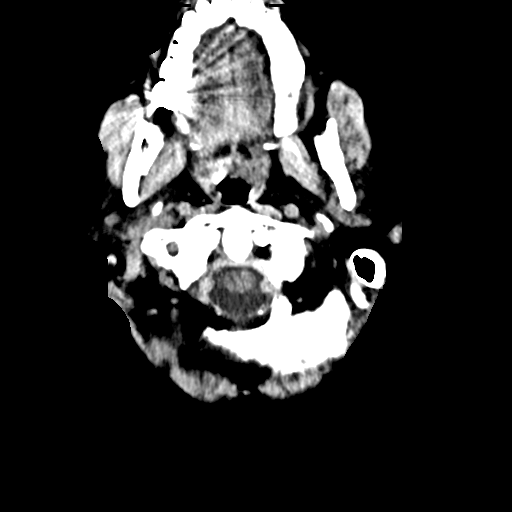
[im 3/33  bone]
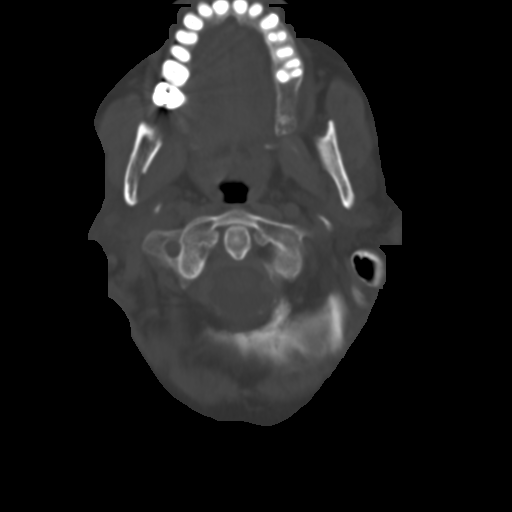
[im 7/33  brain]
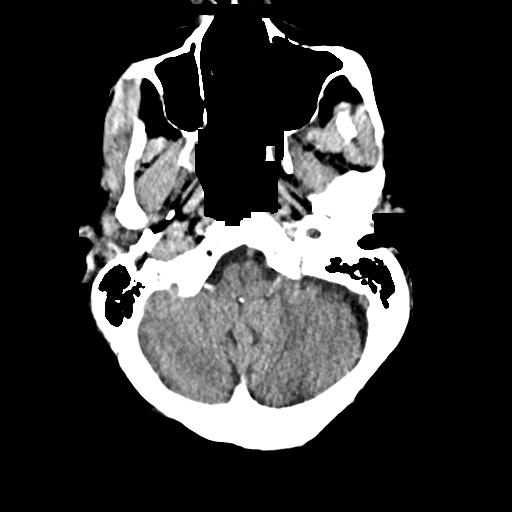
[im 10/33  brain]
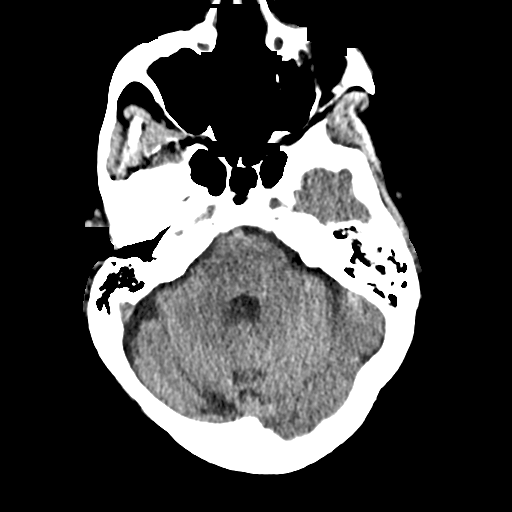
[im 14/33  brain]
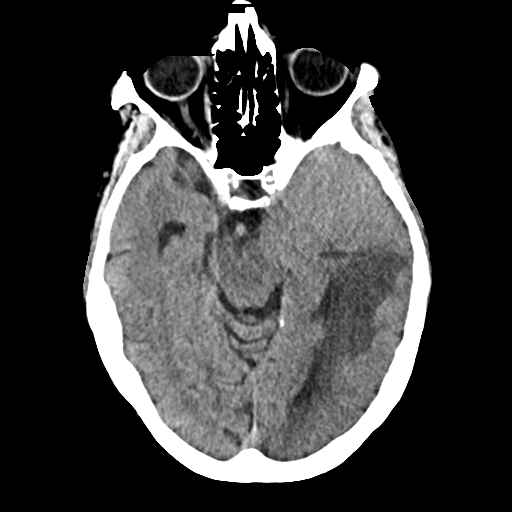
[im 17/33  brain]
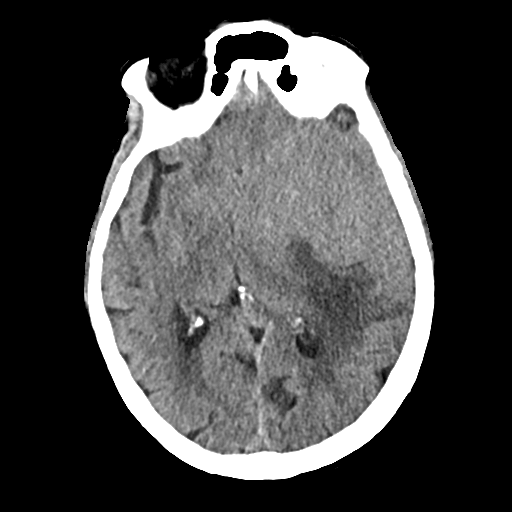
[im 17/33  bone]
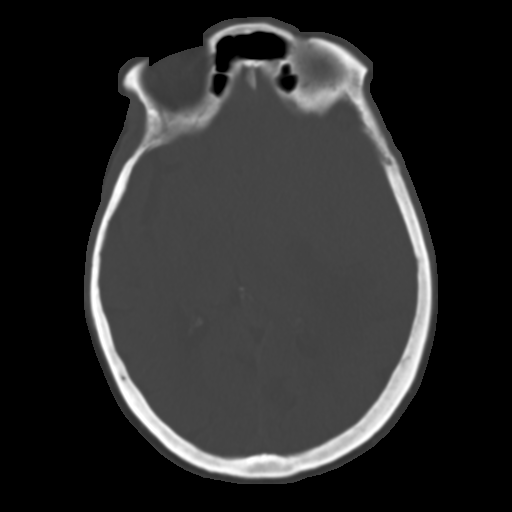
[im 19/33  brain]
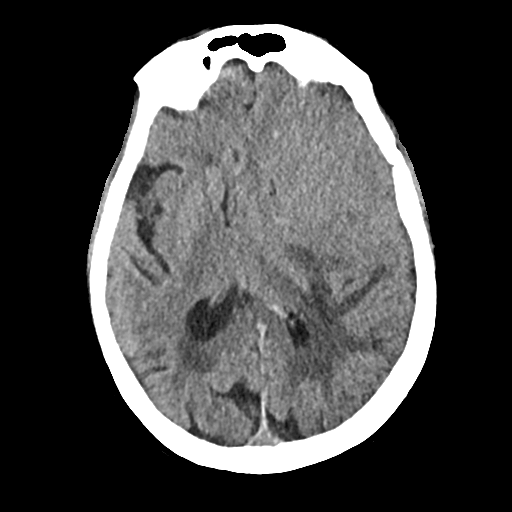
[im 23/33  brain]
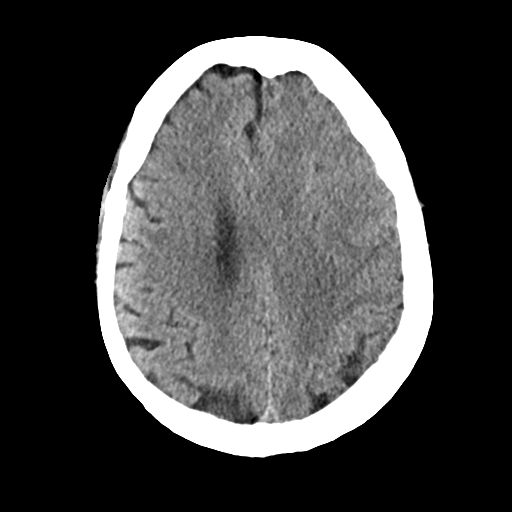
[im 26/33  brain]
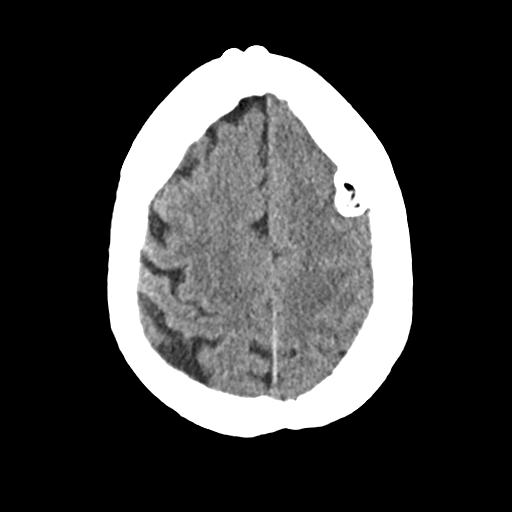
[im 30/33  brain]
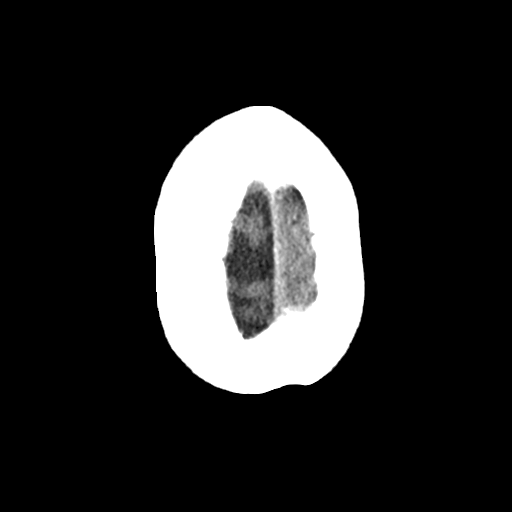
[im 30/33  bone]
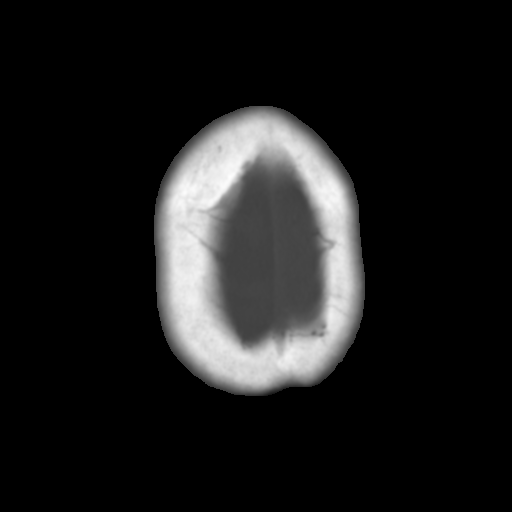

[Series 4: head 3.00 hr40 s3 sag · sagittal · 0.32mm/px · 3 of 52 slices shown]
[im 18/52  brain]
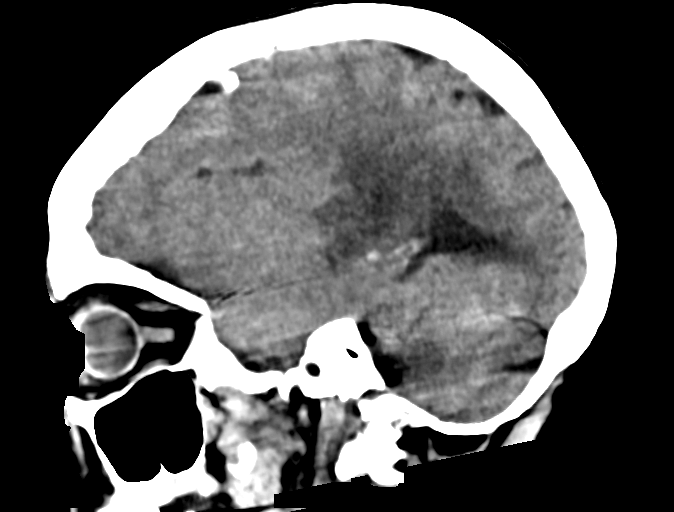
[im 26/52  brain]
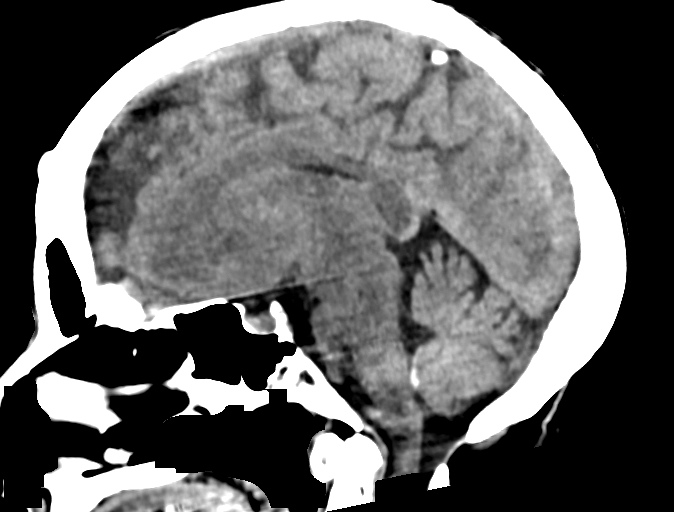
[im 35/52  brain]
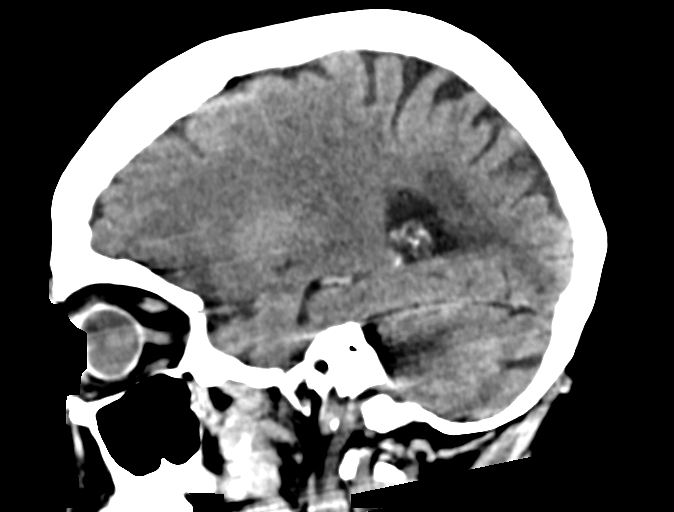

[Series 6: head 3.00 hr40 s3 cor · coronal · 0.32mm/px · 3 of 67 slices shown]
[im 23/67  brain]
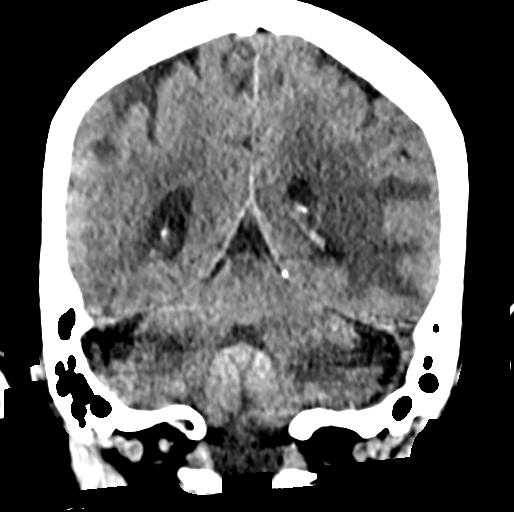
[im 30/67  brain]
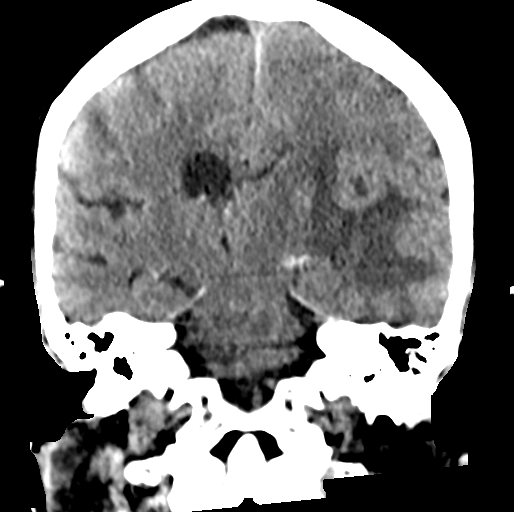
[im 37/67  brain]
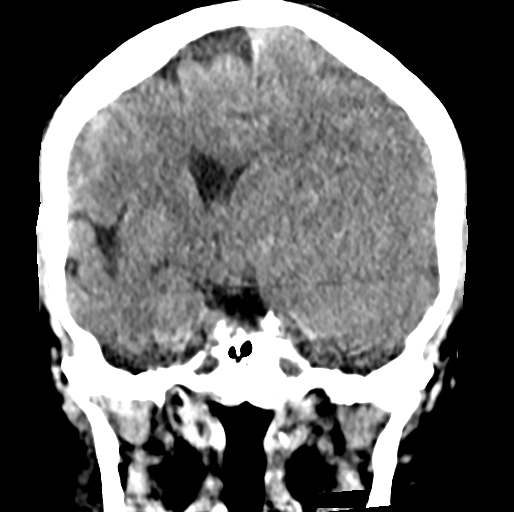

[15 of 47 positions shown; findings below may reference images not displayed]

FINDINGS: Brain: There is a large extra-axial mass, arising from the greater
wing of the sphenoid on the LEFT. It displays avid, homogeneous
enhancement with cross-sectional measurements of 53 x 60 x 61 mm.
There is significant mass effect on the frontal and temporal lobes,
and significant midline shift LEFT-to-RIGHT of 11 mm. Vasogenic
edema extends posteriorly. Findings are consistent with a
meningioma.

Probably normal for age cerebral volume. Assessment for white matter
disease is limited. No visible acute stroke or hemorrhage.

Vascular: Flow voids are maintained.  Carotid atherosclerosis.

Skull: Hyperostosis.  Marrow containing osteoma, LEFT frontal bone.

Sinuses/Orbits: Clear sinuses.  BILATERAL cataract extraction.

Other: None.
IMPRESSION: 6 cm meningioma, LEFT greater sphenoid wing. Significant mass effect
on surrounding structures. 11 mm of LEFT-to-RIGHT shift with
vasogenic edema. Neuro surgical consultation is warranted.

Attempts are being made to contact the ordering provider.

## 2018-02-16 MED ORDER — IOPAMIDOL (ISOVUE-300) INJECTION 61%
75.0000 mL | Freq: Once | INTRAVENOUS | Status: AC | PRN
  Administered 2018-02-16: 75 mL via INTRAVENOUS

## 2018-02-16 NOTE — Telephone Encounter (Signed)
Discussed results of CT brain with pt's daughter Lazarus Gowda on phone today. She is going to try to get [facility] to consider seeing the neurosurgeon. Due to Chessie's dementia, she is not doing very well with all of the testing and seeing new people and waiting around that she has to do lately. Mechele Claude asked that the neurosurgery appt be set up for a couple of weeks from now and she would do her best to get [facility] to go. In the meantime, we will continue her on aspirin 81mg  daily for her DVT rather than lovenox, coumadin, or DOAC-->pt high fall risk, plus there is small chance of needing surgery in near future.  Signed:  Crissie Sickles, MD           02/16/2018

## 2018-02-23 ENCOUNTER — Encounter (HOSPITAL_COMMUNITY): Payer: Self-pay

## 2018-02-23 ENCOUNTER — Emergency Department (HOSPITAL_COMMUNITY): Payer: Medicare Other

## 2018-02-23 ENCOUNTER — Observation Stay (HOSPITAL_COMMUNITY)
Admission: EM | Admit: 2018-02-23 | Discharge: 2018-02-28 | Disposition: A | Discharge status: Home / Self Care | Payer: Medicare Other | Attending: Internal Medicine | Admitting: Internal Medicine

## 2018-02-23 ENCOUNTER — Ambulatory Visit: Payer: Self-pay | Admitting: *Deleted

## 2018-02-23 ENCOUNTER — Other Ambulatory Visit: Payer: Self-pay

## 2018-02-23 DIAGNOSIS — Y92009 Unspecified place in unspecified non-institutional (private) residence as the place of occurrence of the external cause: Secondary | ICD-10-CM

## 2018-02-23 DIAGNOSIS — E876 Hypokalemia: Secondary | ICD-10-CM | POA: Diagnosis not present

## 2018-02-23 DIAGNOSIS — D329 Benign neoplasm of meninges, unspecified: Secondary | ICD-10-CM | POA: Diagnosis not present

## 2018-02-23 DIAGNOSIS — W19XXXA Unspecified fall, initial encounter: Secondary | ICD-10-CM | POA: Diagnosis not present

## 2018-02-23 DIAGNOSIS — Y999 Unspecified external cause status: Secondary | ICD-10-CM | POA: Diagnosis not present

## 2018-02-23 DIAGNOSIS — Z85828 Personal history of other malignant neoplasm of skin: Secondary | ICD-10-CM | POA: Diagnosis not present

## 2018-02-23 DIAGNOSIS — Y929 Unspecified place or not applicable: Secondary | ICD-10-CM | POA: Insufficient documentation

## 2018-02-23 DIAGNOSIS — Z7982 Long term (current) use of aspirin: Secondary | ICD-10-CM | POA: Diagnosis not present

## 2018-02-23 DIAGNOSIS — I10 Essential (primary) hypertension: Secondary | ICD-10-CM | POA: Insufficient documentation

## 2018-02-23 DIAGNOSIS — S6991XA Unspecified injury of right wrist, hand and finger(s), initial encounter: Secondary | ICD-10-CM | POA: Diagnosis not present

## 2018-02-23 DIAGNOSIS — M25531 Pain in right wrist: Secondary | ICD-10-CM | POA: Diagnosis not present

## 2018-02-23 DIAGNOSIS — Y939 Activity, unspecified: Secondary | ICD-10-CM | POA: Insufficient documentation

## 2018-02-23 DIAGNOSIS — F0390 Unspecified dementia without behavioral disturbance: Secondary | ICD-10-CM | POA: Diagnosis not present

## 2018-02-23 DIAGNOSIS — G9389 Other specified disorders of brain: Secondary | ICD-10-CM | POA: Diagnosis not present

## 2018-02-23 DIAGNOSIS — M25562 Pain in left knee: Secondary | ICD-10-CM | POA: Insufficient documentation

## 2018-02-23 DIAGNOSIS — G309 Alzheimer's disease, unspecified: Secondary | ICD-10-CM | POA: Insufficient documentation

## 2018-02-23 DIAGNOSIS — S0990XA Unspecified injury of head, initial encounter: Secondary | ICD-10-CM | POA: Diagnosis not present

## 2018-02-23 DIAGNOSIS — R609 Edema, unspecified: Secondary | ICD-10-CM | POA: Diagnosis not present

## 2018-02-23 DIAGNOSIS — S8992XA Unspecified injury of left lower leg, initial encounter: Secondary | ICD-10-CM | POA: Diagnosis not present

## 2018-02-23 DIAGNOSIS — G939 Disorder of brain, unspecified: Secondary | ICD-10-CM | POA: Diagnosis not present

## 2018-02-23 DIAGNOSIS — S299XXA Unspecified injury of thorax, initial encounter: Secondary | ICD-10-CM | POA: Diagnosis not present

## 2018-02-23 LAB — COMPREHENSIVE METABOLIC PANEL
ALBUMIN: 3.6 g/dL (ref 3.5–5.0)
ALT: 17 U/L (ref 0–44)
ANION GAP: 10 (ref 5–15)
AST: 38 U/L (ref 15–41)
Alkaline Phosphatase: 57 U/L (ref 38–126)
BILIRUBIN TOTAL: 1.3 mg/dL — AB (ref 0.3–1.2)
BUN: 17 mg/dL (ref 8–23)
CALCIUM: 9.8 mg/dL (ref 8.9–10.3)
CO2: 26 mmol/L (ref 22–32)
Chloride: 104 mmol/L (ref 98–111)
Creatinine, Ser: 0.85 mg/dL (ref 0.44–1.00)
GFR calc Af Amer: >60 mL/min (ref >60)
GFR calc non Af Amer: 56 mL/min — ABNORMAL LOW (ref >60)
GLUCOSE: 114 mg/dL — AB (ref 70–99)
Potassium: 3 mmol/L — ABNORMAL LOW (ref 3.5–5.1)
SODIUM: 140 mmol/L (ref 135–145)
TOTAL PROTEIN: 7.8 g/dL (ref 6.5–8.1)

## 2018-02-23 LAB — CBC WITH DIFFERENTIAL/PLATELET
Abs Immature Granulocytes: 0.03 10*3/uL (ref 0.00–0.07)
Basophils Absolute: 0 10*3/uL (ref 0.0–0.1)
Basophils Relative: 0 %
EOS ABS: 0 10*3/uL (ref 0.0–0.5)
Eosinophils Relative: 0 %
HEMATOCRIT: 42.1 % (ref 36.0–46.0)
Hemoglobin: 13.7 g/dL (ref 12.0–15.0)
IMMATURE GRANULOCYTES: 0 %
LYMPHS ABS: 0.8 10*3/uL (ref 0.7–4.0)
Lymphocytes Relative: 8 %
MCH: 31.7 pg (ref 26.0–34.0)
MCHC: 32.5 g/dL (ref 30.0–36.0)
MCV: 97.5 fL (ref 80.0–100.0)
MONO ABS: 0.5 10*3/uL (ref 0.1–1.0)
MONOS PCT: 5 %
Neutro Abs: 8 10*3/uL — ABNORMAL HIGH (ref 1.7–7.7)
Neutrophils Relative %: 87 %
Platelets: 206 10*3/uL (ref 150–400)
RBC: 4.32 MIL/uL (ref 3.87–5.11)
RDW: 12.3 % (ref 11.5–15.5)
WBC: 9.3 10*3/uL (ref 4.0–10.5)
nRBC: 0 % (ref 0.0–0.2)

## 2018-02-23 LAB — CK: CK TOTAL: 677 U/L — AB (ref 38–234)

## 2018-02-23 LAB — I-STAT TROPONIN, ED: Troponin i, poc: 0.07 ng/mL (ref 0.00–0.08)

## 2018-02-23 MED ORDER — POTASSIUM CHLORIDE CRYS ER 20 MEQ PO TBCR
40.0000 meq | EXTENDED_RELEASE_TABLET | Freq: Once | ORAL | Status: AC
  Administered 2018-02-23: 40 meq via ORAL
  Filled 2018-02-23: qty 2

## 2018-02-23 MED ORDER — POTASSIUM CHLORIDE CRYS ER 20 MEQ PO TBCR
20.0000 meq | EXTENDED_RELEASE_TABLET | Freq: Once | ORAL | Status: AC
  Administered 2018-02-23: 20 meq via ORAL
  Filled 2018-02-23: qty 1

## 2018-02-23 MED ORDER — ENOXAPARIN SODIUM 30 MG/0.3ML ~~LOC~~ SOLN
30.0000 mg | SUBCUTANEOUS | Status: DC
  Administered 2018-02-24 – 2018-02-27 (×4): 30 mg via SUBCUTANEOUS
  Filled 2018-02-23 (×5): qty 0.3

## 2018-02-23 MED ORDER — DEXAMETHASONE SODIUM PHOSPHATE 10 MG/ML IJ SOLN
4.0000 mg | Freq: Once | INTRAMUSCULAR | Status: AC
  Administered 2018-02-23: 4 mg via INTRAVENOUS
  Filled 2018-02-23: qty 1

## 2018-02-23 MED ORDER — DOXYCYCLINE HYCLATE 100 MG PO TABS
100.0000 mg | ORAL_TABLET | Freq: Once | ORAL | Status: AC
  Administered 2018-02-23: 100 mg via ORAL
  Filled 2018-02-23: qty 1

## 2018-02-23 MED ORDER — ALPRAZOLAM 0.25 MG PO TABS
0.2500 mg | ORAL_TABLET | Freq: Three times a day (TID) | ORAL | Status: DC | PRN
  Administered 2018-02-24 – 2018-02-27 (×6): 0.25 mg via ORAL
  Filled 2018-02-23 (×6): qty 1

## 2018-02-23 NOTE — ED Notes (Signed)
ED TO INPATIENT HANDOFF REPORT  Name/Age/Gender Cynthia Shepard 84 y.o. female  Code Status    Code Status Orders  (From admission, onward)         Start     Ordered   02/23/18 1547  Do not attempt resuscitation (DNR)  Continuous    Question Answer Comment  In the event of cardiac or respiratory ARREST Do not call a "code blue"   In the event of cardiac or respiratory ARREST Do not perform Intubation, CPR, defibrillation or ACLS   In the event of cardiac or respiratory ARREST Use medication by any route, position, wound care, and other measures to relive pain and suffering. May use oxygen, suction and manual treatment of airway obstruction as needed for comfort.      02/23/18 1546        Code Status History    This patient has a current code status but no historical code status.    Advance Directive Documentation     Most Recent Value  Type of Advance Directive  Healthcare Power of Attorney  Pre-existing out of facility DNR order (yellow form or pink MOST form)  -  "MOST" Form in Place?  -      Home/SNF/Other Home  Chief Complaint fall  Level of Care/Admitting Diagnosis ED Disposition    ED Disposition Condition Cynthia Shepard: Cynthia Shepard [[id number]]  Level of Care: Med-Surg [16]  Diagnosis: Meningioma [facility]) [[id number]]  Admitting Physician: Georgette Shell [[id number]]  Attending Physician: Georgette Shell [phone]  PT Class (Do Not Modify): Observation [104]  PT Acc Code (Do Not Modify): Observation [10022]       Medical History Past Medical History:  Diagnosis Date  . Alzheimer's disease ([place])   . Anemia, unspecified   . DVT of popliteal vein ([place]) 02/10/2018   Acute:  Right popliteal vein, posterior tibial vein, and gastrocnemius veins.  . Dyspepsia and other specified disorders of function of stomach   . Dysphagia, unspecified(787.20)   . Dysrhythmia    Bradycardia, 1st degree AV block.  Dr. Einar Shepard  considered doing a pacemaker but he and pt decided risk>benefit at that time  . Edema   . GAD (generalized anxiety disorder)   . GERD (gastroesophageal reflux disease)   . Hyperlipidemia   . Hypertension   . Internal hemorrhoids without mention of complication   . Neoplasm of brain causing mass effect on adjacent structures ([employer]) 02/16/2018   6 cm mass c/w meningioma; with mass effect on frontal and temporal lobes, and significant midline shift L to R of 41m, vasogenic edema extending posteriorly.  . Nonmelanoma skin cancer    SCC  L side of nose (Dr. TDenna Shepard.  . Osteoarthrosis, unspecified whether generalized or localized, unspecified site   . Osteoporosis, unspecified   . Restless legs syndrome (RLS)   . Unspecified prolapse of vaginal walls   . Unspecified vitamin D deficiency   . Urinary frequency     Allergies No Known Allergies  IV Location/Drains/Wounds Patient Lines/Drains/Airways Status   Active Line/Drains/Airways    Name:   Placement date:   Placement time:   Site:   Days:   Peripheral IV 02/23/18 Left Antecubital   02/23/18    1119    Antecubital   less than 1   External Urinary Catheter   02/23/18    1017    -   less than 1  Labs/Imaging Results for orders placed or performed during the hospital encounter of 02/23/18 (from the past 48 hour(s))  Comprehensive metabolic panel     Status: Abnormal   Collection Time: 02/23/18 11:19 AM  Result Value Ref Range   Sodium 140 135 - 145 mmol/L   Potassium 3.0 (L) 3.5 - 5.1 mmol/L   Chloride 104 98 - 111 mmol/L   CO2 26 22 - 32 mmol/L   Glucose, Bld 114 (H) 70 - 99 mg/dL   BUN 17 8 - 23 mg/dL   Creatinine, Ser 0.85 0.44 - 1.00 mg/dL   Calcium 9.8 8.9 - 10.3 mg/dL   Total Protein 7.8 6.5 - 8.1 g/dL   Albumin 3.6 3.5 - 5.0 g/dL   AST 38 15 - 41 U/L   ALT 17 0 - 44 U/L   Alkaline Phosphatase 57 38 - 126 U/L   Total Bilirubin 1.3 (H) 0.3 - 1.2 mg/dL   GFR calc non Af Amer 56 (L) >60 mL/min   GFR calc Af  Amer >60 >60 mL/min    Comment: (NOTE) The eGFR has been calculated using the CKD EPI equation. This calculation has not been validated in all clinical situations. eGFR's persistently <60 mL/min signify possible Chronic Kidney Disease.    Anion gap 10 5 - 15    Comment: Performed at [facility], [place] [address], [place], [place] [address]  CBC with Differential     Status: Abnormal   Collection Time: 02/23/18 11:19 AM  Result Value Ref Range   WBC 9.3 4.0 - 10.5 K/uL   RBC 4.32 3.87 - 5.11 MIL/uL   Hemoglobin 13.7 12.0 - 15.0 g/dL   HCT 42.1 36.0 - 46.0 %   MCV 97.5 80.0 - 100.0 fL   MCH 31.7 26.0 - 34.0 pg   MCHC 32.5 30.0 - 36.0 g/dL   RDW 12.3 11.5 - 15.5 %   Platelets 206 150 - 400 K/uL   nRBC 0.0 0.0 - 0.2 %   Neutrophils Relative % 87 %   Neutro Abs 8.0 (H) 1.7 - 7.7 K/uL   Lymphocytes Relative 8 %   Lymphs Abs 0.8 0.7 - 4.0 K/uL   Monocytes Relative 5 %   Monocytes Absolute 0.5 0.1 - 1.0 K/uL   Eosinophils Relative 0 %   Eosinophils Absolute 0.0 0.0 - 0.5 K/uL   Basophils Relative 0 %   Basophils Absolute 0.0 0.0 - 0.1 K/uL   Immature Granulocytes 0 %   Abs Immature Granulocytes 0.03 0.00 - 0.07 K/uL    Comment: Performed at [facility], [place] [address] [place], [place] [address]  CK     Status: Abnormal   Collection Time: 02/23/18 11:19 AM  Result Value Ref Range   Total CK 677 (H) 38 - 234 U/L    Comment: Performed at [facility], [place] [address] [place], [place] [address]  I-stat troponin, ED     Status: None   Collection Time: 02/23/18 11:27 AM  Result Value Ref Range   Troponin i, poc 0.07 0.00 - 0.08 ng/mL   Comment 3            Comment: Due to the release kinetics of cTnI, a negative result within the first hours of the onset of symptoms does not rule out myocardial infarction with certainty. If myocardial infarction is still suspected, repeat the test at appropriate intervals.    Dg Chest 2  View  Result Date: 02/23/2018 CLINICAL  DATA:  fell last night; she says that the pt was on the all night and her right hand is swollen; Cynthia Shepard also says that the pt can not walk Patient not able to communicate well, redness anterior knee and swelling right wrist EXAM: CHEST - 2 VIEW COMPARISON:  02/23/2014 FINDINGS: Increasing interstitial opacity in the left perihilar region and left lower lung. Stable cardiomegaly.  Aortic Atherosclerosis (ICD10-170.0). No effusion. Spondylitic changes in the lower thoracic and lumbar spine. IMPRESSION: 1. Some increasing interstitial opacities at the left lung base since 02/23/2014. 2. Stable cardiomegaly Electronically Signed   By: Lucrezia Europe M.D.   On: 02/23/2018 12:34   Dg Wrist Complete Right  Result Date: 02/23/2018 CLINICAL DATA:  fell last night; she says that the pt was on the all night and her right hand is swollen; Cynthia Shepard also says that the pt can not walk Patient not able to communicate well, redness anterior knee and swelling right wrist EXAM: RIGHT WRIST - COMPLETE 3+ VIEW COMPARISON:  None. FINDINGS: Diffuse osteopenia. No acute fracture. Normal alignment. Advanced cartilage loss with subchondral sclerosis and some regional fragmentation at the first carpometacarpal articulation. IMPRESSION: 1. No acute findings. 2. Osteopenia with advanced first [place] DJD. Electronically Signed   By: Lucrezia Europe M.D.   On: 02/23/2018 12:29   Ct Head Wo Contrast  Result Date: 02/23/2018 CLINICAL DATA:  Minor head trauma with high clinical risk. EXAM: CT HEAD WITHOUT CONTRAST TECHNIQUE: Contiguous axial images were obtained from the base of the skull through the vertex without intravenous contrast. COMPARISON:  02/16/2018 FINDINGS: Brain: No hemorrhage or hydrocephalus. There is a large isointense extra-axial mass filling the left middle cranial fossa and extending into the frontal region, 6 cm by recent postcontrast CT. There is prominent mass effect on adjacent structures  with midline shift measuring 11 mm. Extensive vasogenic edema on the left Vascular: No hyperdense vessel. There is atherosclerotic calcification Skull: Negative for fracture. Two osteomas on the forehead. There is a structure with scratch ossified structure from the inner table the left frontal bone with corticomedullary differentiation. Sinuses/Orbits: Bilateral cataract resection. IMPRESSION: 1. No acute/traumatic finding. 2. Known, large presumed meningioma along the left frontotemporal convexity with vasogenic edema and 11 mm of midline shift. Electronically Signed   By: Monte Fantasia M.D.   On: 02/23/2018 11:40   Dg Knee Complete 4 Views Left  Result Date: 02/23/2018 CLINICAL DATA:  fell last night; she says that the pt was on the all night and her right hand is swollen; Cynthia Shepard also says that the pt can not walk Patient not able to communicate well, redness anterior knee and swelling right wrist EXAM: LEFT KNEE - COMPLETE 4+ VIEW COMPARISON:  None. FINDINGS: No evidence of fracture, dislocation, or joint effusion. Early marginal spurs from the patellar articular surface, lateral femoral condyle, and tibial plateau. Patchy femoral-popliteal arterial calcifications. Soft tissues are unremarkable. IMPRESSION: 1. Negative for fracture or other acute finding. 2. Degenerative changes as above. Electronically Signed   By: Lucrezia Europe M.D.   On: 02/23/2018 12:30    Pending Labs Unresulted Labs (From admission, onward)    Start     Ordered   03/02/18 0500  Creatinine, serum  (enoxaparin (LOVENOX)    CrCl < 30 ml/min)  Weekly,   R    Comments:  while on enoxaparin therapy.    02/23/18 1546   02/23/18 1546  CBC  (enoxaparin (LOVENOX)    CrCl < 30 ml/min)  Once,  R    Comments:  Baseline for enoxaparin therapy IF NOT ALREADY DRAWN.  Notify MD if PLT < 100 K.    02/23/18 1546   02/23/18 1546  Creatinine, serum  (enoxaparin (LOVENOX)    CrCl < 30 ml/min)  Once,   R    Comments:  Baseline for enoxaparin  therapy IF NOT ALREADY DRAWN.    02/23/18 1546   02/23/18 1039  Urinalysis, Routine w reflex microscopic  STAT,   STAT     02/23/18 1039   02/23/18 1039  Urine culture  STAT,   STAT     02/23/18 1039          Vitals/Pain Today's Vitals   02/23/18 1600 02/23/18 1615 02/23/18 1630 02/23/18 1700  BP: 109/77  127/88 137/89  Pulse: (!) 59 (!) 29 (!) 57 (!) 53  Resp: (!) _0 (!) 22  Temp:      TempSrc:      SpO2: 97% 99% 98% 98%    Isolation Precautions No active isolations  Medications Medications  potassium chloride SA (K-DUR,KLOR-CON) CR tablet 40 mEq (has no administration in time range)  enoxaparin (LOVENOX) injection 30 mg (has no administration in time range)  ALPRAZolam (XANAX) tablet 0.25 mg (has no administration in time range)  potassium chloride SA (K-DUR,KLOR-CON) CR tablet 20 mEq (20 mEq Oral Given 02/23/18 1419)  doxycycline (VIBRA-TABS) tablet 100 mg (100 mg Oral Given 02/23/18 1419)  dexamethasone (DECADRON) injection 4 mg (4 mg Intravenous Given 02/23/18 1421)    Mobility walks with person assist

## 2018-02-23 NOTE — Telephone Encounter (Signed)
  Additional Information . Commented on: Sounds like a life-threatening emergency to the triager    Yes; call 911  Protocols used: ARM INJURY-A-AH

## 2018-02-23 NOTE — ED Notes (Signed)
Patient transported to X-ray 

## 2018-02-23 NOTE — ED Provider Notes (Signed)
[facility] DEPT Provider Note   CSN: [id number] Arrival date & time: 02/23/18  9735     History   Chief Complaint Chief Complaint  Patient presents with  . Fall  . Joint Swelling    HPI Cynthia Shepard is a 84 y.o. female.  The history is provided by the patient, the EMS personnel and a relative. No language interpreter was used.  Fall    Cynthia Shepard is a 84 y.o. female who presents to the Emergency Department complaining of fall. Level V caveat due to dementia. History is provided by EMS and patient's family. She resides at a senior apartment alone. She had an unwitnessed fall today. Unknown how long she was on the ground, possibly overnight.  She had swelling and pain to her right wrist. She was able to get up with assistance of her daughter following the fall. Unknown if she had a head injury. Daughter states that she had blood-tinged urine in the commode when she was found. She was recently diagnosed with a DVT and treated with aspirin therapy due to high fall risk. She is also recently had an outpatient CT scan of her brain showing a mass with associated vasogenic edema. Past Medical History:  Diagnosis Date  . Alzheimer's disease ([place])   . Anemia, unspecified   . DVT of popliteal vein ([place]) 02/10/2018   Acute:  Right popliteal vein, posterior tibial vein, and gastrocnemius veins.  . Dyspepsia and other specified disorders of function of stomach   . Dysphagia, unspecified(787.20)   . Dysrhythmia    Bradycardia, 1st degree AV block.  Dr. Einar Gip considered doing a pacemaker but he and Cynthia Shepard decided risk>benefit at that time  . Edema   . GAD (generalized anxiety disorder)   . GERD (gastroesophageal reflux disease)   . Hyperlipidemia   . Hypertension   . Internal hemorrhoids without mention of complication   . Neoplasm of brain causing mass effect on adjacent structures ([place]) 02/16/2018   6 cm mass c/w meningioma; with mass effect on  frontal and temporal lobes, and significant midline shift L to R of 48mm, vasogenic edema extending posteriorly.  . Nonmelanoma skin cancer    SCC  L side of nose (Dr. Denna Haggard).  . Osteoarthrosis, unspecified whether generalized or localized, unspecified site   . Osteoporosis, unspecified   . Restless legs syndrome (RLS)   . Unspecified prolapse of vaginal walls   . Unspecified vitamin D deficiency   . Urinary frequency     Patient Active Problem List   Diagnosis Date Noted  . Meningioma ([place]) 02/23/2018  . Hypokalemia 02/23/2018  . Dementia without behavioral disturbance ([place]) 02/23/2018  . Aspiration pneumonia due to regurgitated food ([place]) 02/12/2014  . Esophageal stricture 11/23/2013  . GERD (gastroesophageal reflux disease) 05/25/2013  . Osteopenia 05/25/2013  . Memory loss 05/25/2013  . Anxiety state   . Achalasia 09/04/1944    Past Surgical History:  Procedure Laterality Date  . ABDOMINAL HYSTERECTOMY  2011  . APPENDECTOMY  1972  . ESOPHAGEAL DILATION     Multiple times.  Eventually got botox injections and this helped a lot (as of 03/2016).  . ESOPHAGOGASTRODUODENOSCOPY (EGD) WITH PROPOFOL  05/17/2012   Procedure: ESOPHAGOGASTRODUODENOSCOPY (EGD) WITH PROPOFOL;  Surgeon: Garlan Fair, MD;  Location: [facility] ENDOSCOPY;  Service: Endoscopy;  Laterality: N/A;  . EYE SURGERY     catarct extraction bilateral  . THYROID SURGERY  1954     OB History   None  Home Medications    Prior to Admission medications   Medication Sig Start Date End Date Taking? Authorizing Provider  aspirin EC 81 MG tablet Take 1 tablet (81 mg total) by mouth daily. 02/10/18  Yes McGowen, Adrian [place], MD  clotrimazole-betamethasone (LOTRISONE) cream Apply 1 application topically 2 (two) times daily. 03/09/16  Yes McGowen, Adrian Black[place], MD  fluticasone (FLONASE) 50 MCG/ACT nasal spray INSTILL 2 SPRAYS IN EACH NOSTRIL EVERY DAY Patient taking differently: Place 2 sprays into both nostrils daily as  needed for allergies.  04/08/17  Yes McGowen, Adrian Black[place], MD  Cholecalciferol (VITAMIN D) 2000 UNITS CAPS Take 1 capsule (2,000 Units total) by mouth daily. Patient not taking: Reported on 02/23/2018 11/22/14   Hollace Kinnier L, DO  citalopram (CELEXA) 40 MG tablet TAKE 1 TABLET BY MOUTH EVERY DAY Patient not taking: Reported on 02/23/2018 07/09/17   [name], MD  pantoprazole (PROTONIX) 40 MG tablet TAKE 1 TABLET BY MOUTH DAILY TO LOWER STOMACH ACID AND PROTECT ESOPHAGUS Patient not taking: No sig reported 07/08/17   McGowen, Adrian Blackwater, MD    Family History History reviewed. No pertinent family history.  Social History Social History   Tobacco Use  . Smoking status: Never Smoker  . Smokeless tobacco: Never Used  Substance Use Topics  . Alcohol use: No  . Drug use: No     Allergies   Patient has no known allergies.   Review of Systems Review of Systems  All other systems reviewed and are negative.    Physical Exam Updated Vital Signs BP 135/71   Pulse 60   Temp 98.6 F (37 C) (Oral)   Resp (!) 32   SpO2 99%   Physical Exam  Constitutional: She appears well-developed and well-nourished.  HENT:  Head: Normocephalic and atraumatic.  Cardiovascular: Normal rate and regular rhythm.  No murmur heard. Pulmonary/Chest: Effort normal and breath sounds normal. No respiratory distress.  Abdominal: Soft. There is no tenderness. There is no rebound and no guarding.  Musculoskeletal: She exhibits no tenderness.  Moderate edema to the right wrist with decreased range of motion. No appreciable tenderness on palpation to the wrist. No hip tenderness to palpation. There is tenderness to the left knee with no significant edema.  Neurological: She is alert.  Oriented to hospital. Disoriented to time. No asymmetry of facial movements. Moderately confused. Difficulty in following simple commands. Moves all extremities.  Skin: Skin is warm and dry.  Psychiatric: She has a normal mood  and affect. Her behavior is normal.  Nursing note and vitals reviewed.    ED Treatments / Results  Labs (all labs ordered are listed, but only abnormal results are displayed) Labs Reviewed  COMPREHENSIVE METABOLIC PANEL - Abnormal; Notable for the following components:      Result Value   Potassium 3.0 (*)    Glucose, Bld 114 (*)    Total Bilirubin 1.3 (*)    GFR calc non Af Amer 56 (*)    All other components within normal limits  CBC WITH DIFFERENTIAL/PLATELET - Abnormal; Notable for the following components:   Neutro Abs 8.0 (*)    All other components within normal limits  CK - Abnormal; Notable for the following components:   Total CK 677 (*)    All other components within normal limits  URINE CULTURE  URINALYSIS, ROUTINE W REFLEX MICROSCOPIC  CBC  CREATININE, SERUM  I-STAT TROPONIN, ED    EKG EKG Interpretation  Date/Time:  Wednesday February 23 2018 10:09:13  EDT Ventricular Rate:  47 PR Interval:    QRS Duration: 91 QT Interval:  456 QTC Calculation: 404 R Axis:   70 Text Interpretation:  indeterminant rhythm Confirmed by Quintella Reichert [phone]) on 02/23/2018 11:10:36 AM   Radiology Dg Chest 2 View  Result Date: 02/23/2018 CLINICAL DATA:  fell last night; she says that the Cynthia Shepard was on the all night and her right hand is swollen; Cynthia Shepard also says that the Cynthia Shepard can not walk Patient not able to communicate well, redness anterior knee and swelling right wrist EXAM: CHEST - 2 VIEW COMPARISON:  02/23/2014 FINDINGS: Increasing interstitial opacity in the left perihilar region and left lower lung. Stable cardiomegaly.  Aortic Atherosclerosis (ICD10-170.0). No effusion. Spondylitic changes in the lower thoracic and lumbar spine. IMPRESSION: 1. Some increasing interstitial opacities at the left lung base since 02/23/2014. 2. Stable cardiomegaly Electronically Signed   By: Lucrezia Europe M.D.   On: 02/23/2018 12:34   Dg Wrist Complete Right  Result Date: 02/23/2018 CLINICAL DATA:   fell last night; she says that the Cynthia Shepard was on the all night and her right hand is swollen; Cynthia Shepard also says that the Cynthia Shepard can not walk Patient not able to communicate well, redness anterior knee and swelling right wrist EXAM: RIGHT WRIST - COMPLETE 3+ VIEW COMPARISON:  None. FINDINGS: Diffuse osteopenia. No acute fracture. Normal alignment. Advanced cartilage loss with subchondral sclerosis and some regional fragmentation at the first carpometacarpal articulation. IMPRESSION: 1. No acute findings. 2. Osteopenia with advanced first [place] DJD. Electronically Signed   By: Lucrezia Europe M.D.   On: 02/23/2018 12:29   Ct Head Wo Contrast  Result Date: 02/23/2018 CLINICAL DATA:  Minor head trauma with high clinical risk. EXAM: CT HEAD WITHOUT CONTRAST TECHNIQUE: Contiguous axial images were obtained from the base of the skull through the vertex without intravenous contrast. COMPARISON:  02/16/2018 FINDINGS: Brain: No hemorrhage or hydrocephalus. There is a large isointense extra-axial mass filling the left middle cranial fossa and extending into the frontal region, 6 cm by recent postcontrast CT. There is prominent mass effect on adjacent structures with midline shift measuring 11 mm. Extensive vasogenic edema on the left Vascular: No hyperdense vessel. There is atherosclerotic calcification Skull: Negative for fracture. Two osteomas on the forehead. There is a structure with scratch ossified structure from the inner table the left frontal bone with corticomedullary differentiation. Sinuses/Orbits: Bilateral cataract resection. IMPRESSION: 1. No acute/traumatic finding. 2. Known, large presumed meningioma along the left frontotemporal convexity with vasogenic edema and 11 mm of midline shift. Electronically Signed   By: Monte Fantasia M.D.   On: 02/23/2018 11:40   Dg Knee Complete 4 Views Left  Result Date: 02/23/2018 CLINICAL DATA:  fell last night; she says that the Cynthia Shepard was on the all night and her right hand is  swollen; Cynthia Shepard also says that the Cynthia Shepard can not walk Patient not able to communicate well, redness anterior knee and swelling right wrist EXAM: LEFT KNEE - COMPLETE 4+ VIEW COMPARISON:  None. FINDINGS: No evidence of fracture, dislocation, or joint effusion. Early marginal spurs from the patellar articular surface, lateral femoral condyle, and tibial plateau. Patchy femoral-popliteal arterial calcifications. Soft tissues are unremarkable. IMPRESSION: 1. Negative for fracture or other acute finding. 2. Degenerative changes as above. Electronically Signed   By: Lucrezia Europe M.D.   On: 02/23/2018 12:30    Procedures Procedures (including critical care time)  Medications Ordered in ED Medications  potassium chloride SA (K-DUR,KLOR-CON) CR tablet 40  mEq (has no administration in time range)  enoxaparin (LOVENOX) injection 30 mg (has no administration in time range)  ALPRAZolam (XANAX) tablet 0.25 mg (has no administration in time range)  potassium chloride SA (K-DUR,KLOR-CON) CR tablet 20 mEq (20 mEq Oral Given 02/23/18 1419)  doxycycline (VIBRA-TABS) tablet 100 mg (100 mg Oral Given 02/23/18 1419)  dexamethasone (DECADRON) injection 4 mg (4 mg Intravenous Given 02/23/18 1421)     Initial Impression / Assessment and Plan / ED Course  I have reviewed the triage vital signs and the nursing notes.  Pertinent labs & imaging results that were available during my care of the patient were reviewed by me and considered in my medical decision making (see chart for details).     Patient with history of dementia, recent diagnosis of DVT as well as brain mass here for evaluation following an unwitnessed fall. She is a very confused on examination with intermittent expressive aphasia. CT scan today demonstrate persistent mass with nasal genic edema and left to right shift. Chest x-ray with possible pneumonia, will treat with antibiotics. Discussed with neurosurgeon on-call. Patient does not want any surgical  intervention and family is on board with that plan. Neurosurgeon states that 4 mg of Decadron TID may be offered to evaluate for symptomatic improvement. Concern for patient safety is she is currently residing alone, having falls and confusion and difficulty with communication. Given pneumonia, falls and brain mass plan to admit for further evaluation, consider placement options. Patient and family updated findings of studies and recommendation for admission and they are in agreement with treatment plan. Hospitalist consulted for admission.  Final Clinical Impressions(s) / ED Diagnoses   Final diagnoses:  None    ED Discharge Orders    None       Quintella Reichert, MD 02/23/18 [phone]

## 2018-02-23 NOTE — Progress Notes (Signed)
Patient arrived to the floor at 1730

## 2018-02-23 NOTE — H&P (Signed)
History and Physical    Cynthia Shepard ZOX:[id number] DOB: June 26, 1919 DOA: 02/23/2018  PCP: [name], MD Patient coming from: Home    Chief Complaint: Fall last night  HPI: Cynthia Shepard is a 84 y.o. female with medical history significant of dementia, meningioma admitted status post fall last night in her apartment.  Patient lives in senior housing without any assistance.  Family reports that she has been living alone for a long time and has been doing well .  He is extremely hard of hearing.  Eyes any fevers chills nausea vomiting diarrhea abdominal pain chest pain shortness of breath cough headaches or changes with her vision.  Family reports that she is very pleasant most of the time occasionally gets sundowners.  Patient had a CT of the head that showed large meningioma along the left frontal temporal convexity with vasogenic edema and 11 mm of midline shift.  ED physician spoke to neurosurgery and recommended to start her on Decadron 4 mg 3 times a day.  Patient reported that she does not want any surgery or any aggressive measures.  Discussed CODE STATUS with patient's daughter who is her healthcare POA who said that the patient does not want to be put on life support or kept alive in a life support.  Patient's daughter reported that he she fell last night they do not know how she fell or if she passed out.  However patient woke up this morning and called her daughter and she appeared to be coherent.    ED Course: Patient received Decadron 4 mg IV, doxycycline as chest x-ray showed findings consistent with pneumonia though she is asymptomatic.  Urine culture has been sent from the ER and she received 20 mg of potassium.  Review of Systems: See HPI.  Patient has some dysarthria.  She has good appetite has not had a weight loss denies cough shortness of breath or urinary complaints.  Chest x-ray showed increasing interstitial opacities at the left lung base since 02/23/2014 and  stable cardiomegaly.  X-ray of the left knee shows no evidence of fracture.  3 of the left wrist no evidence of fracture.  Past Medical History:  Diagnosis Date  . Alzheimer's disease ([place])   . Anemia, unspecified   . DVT of popliteal vein ([place]) 02/10/2018   Acute:  Right popliteal vein, posterior tibial vein, and gastrocnemius veins.  . Dyspepsia and other specified disorders of function of stomach   . Dysphagia, unspecified(787.20)   . Dysrhythmia    Bradycardia, 1st degree AV block.  Dr. Einar Gip considered doing a pacemaker but he and pt decided risk>benefit at that time  . Edema   . GAD (generalized anxiety disorder)   . GERD (gastroesophageal reflux disease)   . Hyperlipidemia   . Hypertension   . Internal hemorrhoids without mention of complication   . Neoplasm of brain causing mass effect on adjacent structures ([place]) 02/16/2018   6 cm mass c/w meningioma; with mass effect on frontal and temporal lobes, and significant midline shift L to R of 72mm, vasogenic edema extending posteriorly.  . Nonmelanoma skin cancer    SCC  L side of nose (Dr. Denna Haggard).  . Osteoarthrosis, unspecified whether generalized or localized, unspecified site   . Osteoporosis, unspecified   . Restless legs syndrome (RLS)   . Unspecified prolapse of vaginal walls   . Unspecified vitamin D deficiency   . Urinary frequency     Past Surgical History:  Procedure Laterality Date  .  ABDOMINAL HYSTERECTOMY  2011  . APPENDECTOMY  1972  . ESOPHAGEAL DILATION     Multiple times.  Eventually got botox injections and this helped a lot (as of 03/2016).  . ESOPHAGOGASTRODUODENOSCOPY (EGD) WITH PROPOFOL  05/17/2012   Procedure: ESOPHAGOGASTRODUODENOSCOPY (EGD) WITH PROPOFOL;  Surgeon: Garlan Fair, MD;  Location: [facility];  Service: Endoscopy;  Laterality: N/A;  . EYE SURGERY     catarct extraction bilateral  . THYROID SURGERY  1954     reports that she has never smoked. She has never used smokeless tobacco.  She reports that she does not drink alcohol or use drugs.  No Known Allergies  History reviewed. No pertinent family history. Unacceptable: Noncontributory, unremarkable, or negative. Acceptable: Family history reviewed and not pertinent (If you reviewed it)  Prior to Admission medications   Medication Sig Start Date End Date Taking? Authorizing Provider  aspirin EC 81 MG tablet Take 1 tablet (81 mg total) by mouth daily. 02/10/18  Yes McGowen, Adrian Blackwater, MD  clotrimazole-betamethasone (LOTRISONE) cream Apply 1 application topically 2 (two) times daily. 03/09/16  Yes McGowen, Adrian Blackwater, MD  fluticasone (FLONASE) 50 MCG/ACT nasal spray INSTILL 2 SPRAYS IN EACH NOSTRIL EVERY DAY Patient taking differently: Place 2 sprays into both nostrils daily as needed for allergies.  04/08/17  Yes McGowen, Adrian Blackwater, MD  Cholecalciferol (VITAMIN D) 2000 UNITS CAPS Take 1 capsule (2,000 Units total) by mouth daily. Patient not taking: Reported on 02/23/2018 11/22/14   Hollace Kinnier L, DO  citalopram (CELEXA) 40 MG tablet TAKE 1 TABLET BY MOUTH EVERY DAY Patient not taking: Reported on 02/23/2018 07/09/17   [name], MD  pantoprazole (PROTONIX) 40 MG tablet TAKE 1 TABLET BY MOUTH DAILY TO LOWER STOMACH ACID AND PROTECT ESOPHAGUS Patient not taking: No sig reported 07/08/17   McGowen, Adrian Blackwater, MD    Physical Exam: Very pleasant young lady in no acute distress smiling has some aphasia/dysarthria. Vitals:   02/23/18 1009 02/23/18 1030 02/23/18 1330  BP: (!) 135/121 136/64 (!) 143/81  Pulse: (!) 54 (!) 58 (!) 51  Resp: (!) 22 20 (!) 21  Temp: 98.6 F (37 C)    TempSrc: Oral    SpO2: 97% 97% 98%    Constitutional: NAD, calm, comfortable Vitals:   02/23/18 1009 02/23/18 1030 02/23/18 1330  BP: (!) 135/121 136/64 (!) 143/81  Pulse: (!) 54 (!) 58 (!) 51  Resp: (!) 22 20 (!) 21  Temp: 98.6 F (37 C)    TempSrc: Oral    SpO2: 97% 97% 98%   Eyes: PERRL, lids and conjunctivae normal.HOH ENMT: Mucous  membranes are moist. Posterior pharynx clear of any exudate or lesions.Normal dentition.  Neck: normal, supple, no masses, no thyromegaly Respiratory: clear to auscultation bilaterally, no wheezing, no crackles. Normal respiratory effort. No accessory muscle use.  Cardiovascular: Regular rate and rhythm, no murmurs / rubs / gallops. No extremity edema. 2+ pedal pulses. No carotid bruits.  Abdomen: no tenderness, no masses palpated. No hepatosplenomegaly. Bowel sounds positive.  Musculoskeletal:TRACE EDEMA Skin: no rashes, lesions, ulcers. No induration Neurologic: CN 2-12 grossly intact. Sensation intact, DTR normal. Strength 5/5 in all 4.  Psychiatric: Normal judgment and insight. Alert and oriented x 3. Normal mood.   Labs on Admission: I have personally reviewed following labs and imaging studies  CBC: Recent Labs  Lab 02/23/18 1119  WBC 9.3  NEUTROABS 8.0*  HGB 13.7  HCT 42.1  MCV 97.5  PLT 683   Basic Metabolic Panel:  Recent Labs  Lab 02/23/18 1119  NA 140  K 3.0*  CL 104  CO2 26  GLUCOSE 114*  BUN 17  CREATININE 0.85  CALCIUM 9.8   GFR: Estimated Creatinine Clearance: 38.3 mL/min (by C-G formula based on SCr of 0.85 mg/dL). Liver Function Tests: Recent Labs  Lab 02/23/18 1119  AST 38  ALT 17  ALKPHOS 57  BILITOT 1.3*  PROT 7.8  ALBUMIN 3.6   No results for input(s): LIPASE, AMYLASE in the last 168 hours. No results for input(s): AMMONIA in the last 168 hours. Coagulation Profile: No results for input(s): INR, PROTIME in the last 168 hours. Cardiac Enzymes: Recent Labs  Lab 02/23/18 1119  CKTOTAL 677*   BNP (last 3 results) No results for input(s): PROBNP in the last 8760 hours. HbA1C: No results for input(s): HGBA1C in the last 72 hours. CBG: No results for input(s): GLUCAP in the last 168 hours. Lipid Profile: No results for input(s): CHOL, HDL, LDLCALC, TRIG, CHOLHDL, LDLDIRECT in the last 72 hours. Thyroid Function Tests: No results for  input(s): TSH, T4TOTAL, FREET4, T3FREE, THYROIDAB in the last 72 hours. Anemia Panel: No results for input(s): VITAMINB12, FOLATE, FERRITIN, TIBC, IRON, RETICCTPCT in the last 72 hours. Urine analysis: No results found for: COLORURINE, APPEARANCEUR, LABSPEC, PHURINE, GLUCOSEU, [place], [place], [place], [place], [place], NITRITE, LEUKOCYTESUR  Radiological Exams on Admission: Dg Chest 2 View  Result Date: 02/23/2018 CLINICAL DATA:  fell last night; she says that the pt was on the all night and her right hand is swollen; Joann also says that the pt can not walk Patient not able to communicate well, redness anterior knee and swelling right wrist EXAM: CHEST - 2 VIEW COMPARISON:  02/23/2014 FINDINGS: Increasing interstitial opacity in the left perihilar region and left lower lung. Stable cardiomegaly.  Aortic Atherosclerosis (ICD10-170.0). No effusion. Spondylitic changes in the lower thoracic and lumbar spine. IMPRESSION: 1. Some increasing interstitial opacities at the left lung base since 02/23/2014. 2. Stable cardiomegaly Electronically Signed   By: Lucrezia Europe M.D.   On: 02/23/2018 12:34   Dg Wrist Complete Right  Result Date: 02/23/2018 CLINICAL DATA:  fell last night; she says that the pt was on the all night and her right hand is swollen; Joann also says that the pt can not walk Patient not able to communicate well, redness anterior knee and swelling right wrist EXAM: RIGHT WRIST - COMPLETE 3+ VIEW COMPARISON:  None. FINDINGS: Diffuse osteopenia. No acute fracture. Normal alignment. Advanced cartilage loss with subchondral sclerosis and some regional fragmentation at the first carpometacarpal articulation. IMPRESSION: 1. No acute findings. 2. Osteopenia with advanced first [place] DJD. Electronically Signed   By: Lucrezia Europe M.D.   On: 02/23/2018 12:29   Ct Head Wo Contrast  Result Date: 02/23/2018 CLINICAL DATA:  Minor head trauma with high clinical risk. EXAM: CT HEAD WITHOUT CONTRAST  TECHNIQUE: Contiguous axial images were obtained from the base of the skull through the vertex without intravenous contrast. COMPARISON:  02/16/2018 FINDINGS: Brain: No hemorrhage or hydrocephalus. There is a large isointense extra-axial mass filling the left middle cranial fossa and extending into the frontal region, 6 cm by recent postcontrast CT. There is prominent mass effect on adjacent structures with midline shift measuring 11 mm. Extensive vasogenic edema on the left Vascular: No hyperdense vessel. There is atherosclerotic calcification Skull: Negative for fracture. Two osteomas on the forehead. There is a structure with scratch ossified structure from the inner table the left frontal bone with corticomedullary  differentiation. Sinuses/Orbits: Bilateral cataract resection. IMPRESSION: 1. No acute/traumatic finding. 2. Known, large presumed meningioma along the left frontotemporal convexity with vasogenic edema and 11 mm of midline shift. Electronically Signed   By: Monte Fantasia M.D.   On: 02/23/2018 11:40   Dg Knee Complete 4 Views Left  Result Date: 02/23/2018 CLINICAL DATA:  fell last night; she says that the pt was on the all night and her right hand is swollen; Joann also says that the pt can not walk Patient not able to communicate well, redness anterior knee and swelling right wrist EXAM: LEFT KNEE - COMPLETE 4+ VIEW COMPARISON:  None. FINDINGS: No evidence of fracture, dislocation, or joint effusion. Early marginal spurs from the patellar articular surface, lateral femoral condyle, and tibial plateau. Patchy femoral-popliteal arterial calcifications. Soft tissues are unremarkable. IMPRESSION: 1. Negative for fracture or other acute finding. 2. Degenerative changes as above. Electronically Signed   By: Lucrezia Europe M.D.   On: 02/23/2018 12:30    EKG: Independently reviewed.   Assessment/Plan Active Problems:   * No active hospital problems. *   #1 status post fall patient found found to  have a left to right shift with history of meningioma by CT scan.  Patient reports she does not want any surgery or aggressive measures at this time will continue with Decadron 4 mg IV 3 times daily.  Family and patient interested in palliative care who I will consult.  She really would like to go home and be at home when she passes away.  #2?  Pneumonia patient received a dose of doxycycline the ER.  Since she does not have any symptoms I am not continuing that or giving her any antibiotics at this time.  Follow up tomorrow.  #3 hypokalemia repleted  DVT prophylaxis: Lovenox Code Status: DO NOT RESUSCITATE Family Communication: Discussed with daughter grandson and daughter's husband Disposition Plan: Family would like to talk with palliative care and then decide on discharge Consults called: Palliative care Admission status: Observation   Georgette Shell MD [facility]  If 7PM-7AM, please contact night-coverage [email or web address] Password [facility]  02/23/2018, 3:32 PM

## 2018-02-23 NOTE — ED Notes (Signed)
Family at bedside. 

## 2018-02-23 NOTE — Telephone Encounter (Signed)
Pt's daughter Arville Go called because the pt fell last night; she says that the pt was on the all night and her right hand is swollen; Arville Go also says that the pt can not walk and it took 2 people to get her off the floor ; she also reports that the pt is alert and coherent; recommendation made per nurse triage protocol; will route to office for notification of this encounter. Answer Assessment - Initial Assessment Questions 1. MECHANISM: "How did the injury happen?"     Fell last night 02/22/18 2. ONSET: "When did the injury happen?" (Minutes or hours ago)      hours 3. LOCATION: "Where is the injury located?"      *No Answer* 4. APPEARANCE of INJURY: "What does the injury look like?"      *No Answer* 5. SEVERITY: "Can you use the arm normally?"      *No Answer* 6. SWELLING or BRUISING: "is there any swelling or bruising?" If so, ask: "How large is it? (e.g., inches, centimeters)    yes 7. PAIN: "Is there pain?" If so, ask: "How bad is the pain?"    (Scale 1-10; or mild, moderate, severe)     *No Answer* 8. TETANUS: For any breaks in the skin, ask: "When was the last tetanus booster?"     *No Answer* 9. OTHER SYMPTOMS: "Do you have any other symptoms?"  (e.g., numbness in hand)     Can not walk 10. PREGNANCY: "Is there any chance you are pregnant?" "When was your last menstrual period?" no  Protocols used: ARM INJURY-A-AH

## 2018-02-23 NOTE — ED Triage Notes (Signed)
Per EMS: Unwitnessed fall last night.  Pt c/o of wrist swelling, no c/o of pain.  Pt denies LOC.  No blood thinners, just daily ASA. Daughter states she noticed blood in her urine.

## 2018-02-24 DIAGNOSIS — D329 Benign neoplasm of meninges, unspecified: Secondary | ICD-10-CM | POA: Diagnosis not present

## 2018-02-24 DIAGNOSIS — M25531 Pain in right wrist: Secondary | ICD-10-CM | POA: Diagnosis not present

## 2018-02-24 DIAGNOSIS — Y92009 Unspecified place in unspecified non-institutional (private) residence as the place of occurrence of the external cause: Secondary | ICD-10-CM

## 2018-02-24 DIAGNOSIS — W19XXXA Unspecified fall, initial encounter: Secondary | ICD-10-CM

## 2018-02-24 LAB — URINALYSIS, ROUTINE W REFLEX MICROSCOPIC
BILIRUBIN URINE: NEGATIVE
Bacteria, UA: NONE SEEN
Glucose, UA: NEGATIVE mg/dL
HGB URINE DIPSTICK: NEGATIVE
KETONES UR: 5 mg/dL — AB
NITRITE: NEGATIVE
PROTEIN: NEGATIVE mg/dL
Specific Gravity, Urine: 1.009 (ref 1.005–1.030)
pH: 5 (ref 5.0–8.0)

## 2018-02-24 LAB — BASIC METABOLIC PANEL
ANION GAP: 8 (ref 5–15)
BUN: 14 mg/dL (ref 8–23)
CALCIUM: 9.8 mg/dL (ref 8.9–10.3)
CO2: 24 mmol/L (ref 22–32)
CREATININE: 0.79 mg/dL (ref 0.44–1.00)
Chloride: 110 mmol/L (ref 98–111)
GFR calc Af Amer: >60 mL/min (ref >60)
GFR calc non Af Amer: >60 mL/min (ref >60)
GLUCOSE: 113 mg/dL — AB (ref 70–99)
Potassium: 4 mmol/L (ref 3.5–5.1)
SODIUM: 142 mmol/L (ref 135–145)

## 2018-02-24 MED ORDER — GUAIFENESIN ER 600 MG PO TB12
600.0000 mg | ORAL_TABLET | Freq: Two times a day (BID) | ORAL | Status: DC
  Administered 2018-02-24 – 2018-02-25 (×3): 600 mg via ORAL
  Filled 2018-02-24 (×3): qty 1

## 2018-02-24 MED ORDER — DEXAMETHASONE 4 MG PO TABS: 4.0000 mg | ORAL_TABLET | Freq: Four times a day (QID) | ORAL | Status: DC

## 2018-02-24 MED ORDER — DEXAMETHASONE 4 MG PO TABS
4.0000 mg | ORAL_TABLET | Freq: Three times a day (TID) | ORAL | Status: DC
  Administered 2018-02-24 – 2018-02-28 (×13): 4 mg via ORAL
  Filled 2018-02-24 (×13): qty 1

## 2018-02-24 NOTE — Progress Notes (Signed)
PROGRESS NOTE    Cynthia Shepard  FMB:[id number] DOB: 11-06-1919 DOA: 02/23/2018 PCP: [name], MD   Brief Narrative: Patient is a 84 year old female with past medical history significant for dementia, recently diagnosed meningioma who presented with a fall in her apartment.  She lives in senior housing without any assistance.  CT head done on presentation showed large meningioma along the left frontal temporal convexity with vasogenic edema and 11 mm midline shift.  Assessment & Plan:   Principal Problem:   Meningioma [facility]) Active Problems:   Hypokalemia   Dementia without behavioral disturbance ([place])   Fall at home, initial encounter  Meningioma: Recently diagnosed.  Follows with neurosurgery Dr.Nandkman.    Patient does not want any intervention .  CT finding as above.  Started on Decadron. Denies any headache, nausea or vomiting.  Patient and family interested on discussion with palliative care for goals of care discussion, possible hospice at home.  Palliative care consulted.  Hypokalemia: Supplemented with potassium.  We will check the levels today.  [place].  Imagings and CT head did not show any fracture dislocation, no acute intracranial abnormalities.  PT evaluated her and recommended home health PT at ALF.  Social worker consulted.  Dementia: Continue supportive care.  Dysphagia: Choked this morning while eating food.  Had some cough.  Continue Mucinex.  Speech therapy consulted. Chest x-ray done on presentation showed some increasing interstitial opacities at the left lung base since 02/23/2014.  Less suspicious for pneumonia.     DVT prophylaxis: SCD Code Status: DNR Family Communication: Daughter present at the bedside Disposition Plan: Home versus ALF after palliative care evaluation, speech therapy evaluation   Consultants: None  Procedures: None  Antimicrobials: None  Subjective: Patient seen and examined the bedside this morning.   She was bothered by cough but otherwise denies any shortness of breath or chest pain, nausea, vomiting or headache.  Hemodynamically stable  Objective: Vitals:   02/23/18 1700 02/23/18 1738 02/23/18 2209 02/24/18 0453  BP: 137/89 (!) 156/62 (!) 122/48 (!) 125/55  Pulse: (!) 53 (!) 58 60 65  Resp: (!) 22  17 18   Temp:  97.7 F (36.5 C) (!) 97.4 F (36.3 C) 97.9 F (36.6 C)  TempSrc:  Oral Oral Oral  SpO2: 98% 97% 95% 97%    Intake/Output Summary (Last 24 hours) at 02/24/2018 1015 Last data filed at 02/24/2018 0948 Gross per 24 hour  Intake 360 ml  Output -  Net 360 ml   There were no vitals filed for this visit.  Examination:  General exam: Appears calm and comfortable ,Not in distress,thin Built,frail elderly female HEENT:PERRL,Oral mucosa moist, Ear/Nose normal on gross exam Respiratory system: Bilateral decreased air entry on the bases Cardiovascular system: S1 & S2 heard, RRR. No JVD, murmurs, rubs, gallops or clicks. No pedal edema. Gastrointestinal system: Abdomen is nondistended, soft and nontender. No organomegaly or masses felt. Normal bowel sounds heard. Central nervous system: Alert and oriented. No focal neurological deficits. Extremities: No edema, no clubbing ,no cyanosis, distal peripheral pulses palpable. Skin: No rashes, lesions or ulcers,no icterus ,no pallor   Data Reviewed: I have personally reviewed following labs and imaging studies  CBC: Recent Labs  Lab 02/23/18 1119  WBC 9.3  NEUTROABS 8.0*  HGB 13.7  HCT 42.1  MCV 97.5  PLT 701   Basic Metabolic Panel: Recent Labs  Lab 02/23/18 1119  NA 140  K 3.0*  CL 104  CO2 26  GLUCOSE 114*  BUN 17  CREATININE 0.85  CALCIUM 9.8   GFR: Estimated Creatinine Clearance: 38.3 mL/min (by C-G formula based on SCr of 0.85 mg/dL). Liver Function Tests: Recent Labs  Lab 02/23/18 1119  AST 38  ALT 17  ALKPHOS 57  BILITOT 1.3*  PROT 7.8  ALBUMIN 3.6   No results for input(s): LIPASE,  AMYLASE in the last 168 hours. No results for input(s): AMMONIA in the last 168 hours. Coagulation Profile: No results for input(s): INR, PROTIME in the last 168 hours. Cardiac Enzymes: Recent Labs  Lab 02/23/18 1119  CKTOTAL 677*   BNP (last 3 results) No results for input(s): PROBNP in the last 8760 hours. HbA1C: No results for input(s): HGBA1C in the last 72 hours. CBG: No results for input(s): GLUCAP in the last 168 hours. Lipid Profile: No results for input(s): CHOL, HDL, LDLCALC, TRIG, CHOLHDL, LDLDIRECT in the last 72 hours. Thyroid Function Tests: No results for input(s): TSH, T4TOTAL, FREET4, T3FREE, THYROIDAB in the last 72 hours. Anemia Panel: No results for input(s): VITAMINB12, FOLATE, FERRITIN, TIBC, IRON, RETICCTPCT in the last 72 hours. Sepsis Labs: No results for input(s): PROCALCITON, LATICACIDVEN in the last 168 hours.  No results found for this or any previous visit (from the past 240 hour(s)).       Radiology Studies: Dg Chest 2 View  Result Date: 02/23/2018 CLINICAL DATA:  fell last night; she says that the pt was on the all night and her right hand is swollen; Joann also says that the pt can not walk Patient not able to communicate well, redness anterior knee and swelling right wrist EXAM: CHEST - 2 VIEW COMPARISON:  02/23/2014 FINDINGS: Increasing interstitial opacity in the left perihilar region and left lower lung. Stable cardiomegaly.  Aortic Atherosclerosis (ICD10-170.0). No effusion. Spondylitic changes in the lower thoracic and lumbar spine. IMPRESSION: 1. Some increasing interstitial opacities at the left lung base since 02/23/2014. 2. Stable cardiomegaly Electronically Signed   By: Lucrezia Europe M.D.   On: 02/23/2018 12:34   Dg Wrist Complete Right  Result Date: 02/23/2018 CLINICAL DATA:  fell last night; she says that the pt was on the all night and her right hand is swollen; Joann also says that the pt can not walk Patient not able to communicate  well, redness anterior knee and swelling right wrist EXAM: RIGHT WRIST - COMPLETE 3+ VIEW COMPARISON:  None. FINDINGS: Diffuse osteopenia. No acute fracture. Normal alignment. Advanced cartilage loss with subchondral sclerosis and some regional fragmentation at the first carpometacarpal articulation. IMPRESSION: 1. No acute findings. 2. Osteopenia with advanced first [place] DJD. Electronically Signed   By: Lucrezia Europe M.D.   On: 02/23/2018 12:29   Ct Head Wo Contrast  Result Date: 02/23/2018 CLINICAL DATA:  Minor head trauma with high clinical risk. EXAM: CT HEAD WITHOUT CONTRAST TECHNIQUE: Contiguous axial images were obtained from the base of the skull through the vertex without intravenous contrast. COMPARISON:  02/16/2018 FINDINGS: Brain: No hemorrhage or hydrocephalus. There is a large isointense extra-axial mass filling the left middle cranial fossa and extending into the frontal region, 6 cm by recent postcontrast CT. There is prominent mass effect on adjacent structures with midline shift measuring 11 mm. Extensive vasogenic edema on the left Vascular: No hyperdense vessel. There is atherosclerotic calcification Skull: Negative for fracture. Two osteomas on the forehead. There is a structure with scratch ossified structure from the inner table the left frontal bone with corticomedullary differentiation. Sinuses/Orbits: Bilateral cataract resection. IMPRESSION: 1. No acute/traumatic  finding. 2. Known, large presumed meningioma along the left frontotemporal convexity with vasogenic edema and 11 mm of midline shift. Electronically Signed   By: Monte Fantasia M.D.   On: 02/23/2018 11:40   Dg Knee Complete 4 Views Left  Result Date: 02/23/2018 CLINICAL DATA:  fell last night; she says that the pt was on the all night and her right hand is swollen; Joann also says that the pt can not walk Patient not able to communicate well, redness anterior knee and swelling right wrist EXAM: LEFT KNEE - COMPLETE 4+ VIEW  COMPARISON:  None. FINDINGS: No evidence of fracture, dislocation, or joint effusion. Early marginal spurs from the patellar articular surface, lateral femoral condyle, and tibial plateau. Patchy femoral-popliteal arterial calcifications. Soft tissues are unremarkable. IMPRESSION: 1. Negative for fracture or other acute finding. 2. Degenerative changes as above. Electronically Signed   By: Lucrezia Europe M.D.   On: 02/23/2018 12:30        Scheduled Meds: . dexamethasone  4 mg Oral Q8H  . enoxaparin (LOVENOX) injection  30 mg Subcutaneous Q24H  . guaiFENesin  600 mg Oral BID   Continuous Infusions:   LOS: 0 days    Time spent: 35 mins.More than 50% of that time was spent in counseling and/or coordination of care.      Shelly Coss, MD [facility] Pager [phone]  If 7PM-7AM, please contact night-coverage [email or web address] Password [email or web address] 02/24/2018, 10:15 AM

## 2018-02-24 NOTE — Evaluation (Signed)
Physical Therapy Evaluation Patient Details Name: Cynthia Shepard MRN: [id number] DOB: 1919-07-30 Today's Date: 02/24/2018   History of Present Illness  84 yo female admitted 02/23/18 after fall at  independent senior apartment, increased difficulty with expressive speech. Patient has H/O dementia,,  recent Right DVT, 02/16/18 CT demonstrated meningioma with shift.   Clinical Impression  Patient's daughter present to provide information. Patient has expressive difficulties but follows directions with tactile and visual cues. Patient relies on UE support for mobility, standing and transfers. Will assess gait at another visit as patient has meal delivered. Per daughter, plan is for transition to ALF level of which patient will require assistance at this time.  patient with noted right facial droop. Also some choking/coughing with liquids(Hot coffee)/ daughter reports patient has been on soft diet for some time. Patient noted feeding self slowly, no noted coordination issues  of UE's. Pt admitted with above diagnosis. Pt currently with functional limitations due to the deficits listed below (see PT Problem List). Pt will benefit from skilled PT to increase their independence and safety with mobility to allow discharge to the venue listed below.       Follow Up Recommendations Home health PT at ALF    Equipment Recommendations  None recommended by PT    Recommendations for Other Services       Precautions / Restrictions Precautions Precautions: Fall Restrictions Weight Bearing Restrictions: No      Mobility  Bed Mobility Overal bed mobility: Needs Assistance Bed Mobility: Supine to Sit     Supine to sit: Supervision     General bed mobility comments: patient self assisted to sitting up  Transfers Overall transfer level: Needs assistance Equipment used: 1 person hand held assist Transfers: Sit to/from [facility] Sit to Stand: Min assist Stand pivot  transfers: Min assist;Mod assist       General transfer comment: patient relies on hands to reach for surface to steady self for transfer to [facility] then to recliner.  Ambulation/Gait             General Gait Details: TBA  Stairs            Wheelchair Mobility    Modified Rankin (Stroke Patients Only)       Balance Overall balance assessment: Needs assistance;History of Falls Sitting-balance support: No upper extremity supported;Feet supported Sitting balance-Leahy Scale: Good     Standing balance support: During functional activity;Single extremity supported Standing balance-Leahy Scale: Poor Standing balance comment: relies on UE support                             Pertinent Vitals/Pain Pain Assessment: No/denies pain    Home Living                   Additional Comments: per daughter, transition to ALF    Prior Function Level of Independence: Needs assistance   Gait / Transfers Assistance Needed: until recently, ambulated independently, started  using cane due to holding to things in apartment.           Hand Dominance        Extremity/Trunk Assessment   Upper Extremity Assessment Upper Extremity Assessment: Generalized weakness    Lower Extremity Assessment Lower Extremity Assessment: Generalized weakness    Cervical / Trunk Assessment Cervical / Trunk Assessment: Kyphotic  Communication      Cognition Arousal/Alertness: Awake/alert Behavior During Therapy: WFL for tasks assessed/performed Overall Cognitive  Status: Impaired/Different from baseline                                 General Comments: patient follows commands, is HOH, relies to daughter for direction. did not ask orientation  Q's.       General Comments      Exercises     Assessment/Plan    PT Assessment Patient needs continued PT services(can be at ALF)  PT Problem List Decreased mobility;Decreased knowledge of  precautions;Decreased safety awareness;Decreased balance;Decreased activity tolerance;Decreased knowledge of use of DME;Decreased cognition;Decreased strength       PT Treatment Interventions DME instruction;Gait training;Functional mobility training;Therapeutic activities;Patient/family education    PT Goals (Current goals can be found in the Care Plan section)  Acute Rehab PT Goals Patient Stated Goal: to go to ALF PT Goal Formulation: With family Time For Goal Achievement: 03/10/18 Potential to Achieve Goals: Fair    Frequency Min 2X/week   Barriers to discharge Decreased caregiver support      Co-evaluation               AM-PAC PT "6 Clicks" Daily Activity  Outcome Measure Difficulty turning over in bed (including adjusting bedclothes, sheets and blankets)?: A Little Difficulty moving from lying on back to sitting on the side of the bed? : A Little Difficulty sitting down on and standing up from a chair with arms (e.g., wheelchair, bedside commode, etc,.)?: Unable Help needed moving to and from a bed to chair (including a wheelchair)?: Total Help needed walking in hospital room?: Total Help needed climbing 3-5 steps with a railing? : Total 6 Click Score: 10    End of Session Equipment Utilized During Treatment: Gait belt Activity Tolerance: Patient tolerated treatment well Patient left: in chair;with call bell/phone within reach;with chair alarm set;with family/visitor present Nurse Communication: Mobility status PT Visit Diagnosis: Unsteadiness on feet (R26.81);History of falling (Z91.81)    Time: 1610-9604 PT Time Calculation (min) (ACUTE ONLY): 20 min   Charges:   PT Evaluation $PT Eval Low Complexity: [place] PT Acute Rehabilitation Services Pager [phone] Office [phone]   [name] 02/24/2018, 9:01 AM

## 2018-02-24 NOTE — Clinical Social Work Note (Signed)
Clinical Social Work Assessment  Patient Details  Name: Cynthia Shepard MRN: [id number] Date of Birth: 02-17-1920  Date of referral:  02/24/18               Reason for consult:  Facility Placement                Permission sought to share information with:  Family Supports Permission granted to share information::  Yes, Release of Information Signed  Name::     daughter Arville Go  Agency::     Relationship::     Contact Information:     Housing/Transportation Living arrangements for the past 2 months:  Apartment(at independent living) Source of Information:  Patient, Adult Children Patient Interpreter Needed:  None Criminal Activity/Legal Involvement Pertinent to Current Situation/Hospitalization:  No - Comment as needed Significant Relationships:  Other Family Members, Adult Children, Friend, Neighbor Lives with:  Self Do you feel safe going back to the place where you live?  No Need for family participation in patient care:  Yes (Comment)(pt's daughter and grandson assisting)  Care giving concerns:  Pt admitted from independent living apartment where she resides alone. Family explains "Up until about 3 weeks ago she was very independent and living a vibrant lifestyle, busy with neighbors and friends." States pt's functional status and cognitive ability (recall, disorientation) has declined markedly in last 3 weeks.  Admitted after having a fall at home. Recent diagnosis of Meningioma with no plans for intervention.   Social Worker assessment / plan:  CSW consulted to assist with disposition- family had explained to staff that they were looking into ALF placement for pt. However family explained to [place] upon meeting that they are actually interested in pt going to rehab at skilled facility prior to deciding next venue. States "we would like to see if her ambulation can improve enough to go to an ALF or back to independent living." Explained process of SNF qualification by skilled/therapy  needs and insurance requiring prior authorization. Family understanding. Would like pt to be referred to [place] completed FL2 and made referrals. Will follow up with bed offers.   Employment status:  Retired Engineer, maintenance (IT)) PT Recommendations:  Home with [place] / Referral to community resources:  [place]  Patient/Family's Response to care:  appreciative  Patient/Family's Understanding of and Emotional Response to Diagnosis, Current Treatment, and Prognosis:  Emotionally seemed accepting that pt's decline will change her care needs going forward, reasonable re: how to move forward with decisions and making goals.   Emotional Assessment Appearance:  Appears stated age Attitude/Demeanor/Rapport:  (appropriate) Affect (typically observed):  Calm, Appropriate Orientation:  Oriented to Self, Oriented to Place, Oriented to Situation Alcohol / Substance use:  Not Applicable Psych involvement (Current and /or in the community):  No (Comment)  Discharge Needs  Concerns to be addressed:  Discharge Planning Concerns, Decision making concerns Readmission within the last 30 days:  No Current discharge risk:  Lives alone, Dependent with Mobility Barriers to Discharge:  Continued Medical Work up, [employer], LCSW 02/24/2018, 4:02 PM  [phone]

## 2018-02-24 NOTE — [place] FL2 (Addendum)
   MEDICAID FL2 LEVEL OF CARE SCREENING TOOL     IDENTIFICATION  Patient Name: Cynthia Shepard Birthdate: 20-Mar-1920 Sex: female Admission Date (Current Location): 02/23/2018  [facility] and [place] Number:  Herbalist and Address:  [facility],  [place] [address] [place]      Provider Number: [id number]  Attending Physician Name and Address:  Shelly Coss, MD  Relative Name and Phone Number:       Current Level of Care: Hospital Recommended Level of Care: [place] Prior Approval Number:    Date Approved/Denied:   PASRR Number: [id number]  Discharge Plan: SNF    Current Diagnoses: Patient Active Problem List   Diagnosis Date Noted  . Fall at home, initial encounter 02/24/2018  . Meningioma ([place]) 02/23/2018  . Hypokalemia 02/23/2018  . Dementia without behavioral disturbance ([place]) 02/23/2018  . Aspiration pneumonia due to regurgitated food ([place]) 02/12/2014  . Esophageal stricture 11/23/2013  . GERD (gastroesophageal reflux disease) 05/25/2013  . Osteopenia 05/25/2013  . Memory loss 05/25/2013  . Anxiety state   . Achalasia 09/04/1944    [place] RESPIRATION BLADDER Height & Weight     Self, Place  Normal Continent, External catheter Weight:   Height:     BEHAVIORAL SYMPTOMS/MOOD NEUROLOGICAL BOWEL NUTRITION STATUS      Continent Diet(regular diet)  AMBULATORY STATUS COMMUNICATION OF NEEDS Skin   Extensive Assist Verbally Normal                       Personal Care Assistance Level of Assistance  Bathing, Feeding, Dressing Bathing Assistance: Limited assistance Feeding assistance: Independent Dressing Assistance: Limited assistance     Functional Limitations Info  Sight, Hearing, Speech Sight Info: Adequate Hearing Info: Adequate Speech Info: Adequate    SPECIAL CARE FACTORS FREQUENCY  PT (By licensed PT), OT (By licensed OT), ST     PT Frequency: 5x OT Frequency: 5x  ST  frequency: 1x            Contractures Contractures Info: Not present    Additional Factors Info  Code Status, Allergies Code Status Info: DNR Allergies Info: nka           Current Medications (02/24/2018):  This is the current hospital active medication list Current Facility-Administered Medications  Medication Dose Route Frequency Provider Last Rate Last Dose  . ALPRAZolam Duanne Moron) tablet 0.25 mg  0.25 mg Oral TID PRN Georgette Shell, MD   0.25 mg at 02/24/18 0226  . dexamethasone (DECADRON) tablet 4 mg  4 mg Oral Q8H Adhikari, Amrit, MD      . enoxaparin (LOVENOX) injection 30 mg  30 mg Subcutaneous Q24H Georgette Shell, MD      . guaiFENesin [facility]) 12 hr tablet 600 mg  600 mg Oral BID Shelly Coss, MD         Discharge Medications: Please see discharge summary for a list of discharge medications.  Relevant Imaging Results:  Relevant Lab Results:   Additional Information SS# [id number]  [name], LCSW

## 2018-02-24 NOTE — Progress Notes (Signed)
Physical Therapy Treatment Patient Details Name: Cynthia Shepard MRN: [id number] DOB: May 21, 1919 Today's Date: 02/24/2018    History of Present Illness 84 yo female admitted 02/23/18 after fall at  independent senior apartment, increased difficulty with expressive speech. Patient has H/O dementia,,  recent Right DVT, 02/16/18 CT demonstrated meningioma with shift.     PT Comments     patient's gait assessed  And presents with decreased balance, requires assistance for safety , use of RW. Patient with noted increased SOB, saturation 97% RA. Patient will require assistance for all ambulation mobility at this time.  Ppatient again noted to have Coughing when drinking coffee.   Follow Up Recommendations  Home health PT(if DC to ALF)     Equipment Recommendations  None recommended by PT    Recommendations for Other Services       Precautions / Restrictions Precautions Precautions: Fall Restrictions Weight Bearing Restrictions: No    Mobility  Bed Mobility Overal bed mobility: Needs Assistance Bed Mobility: Supine to Sit     Supine to sit: Supervision     General bed mobility comments: in recliner  Transfers Overall transfer level: Needs assistance Equipment used: Rolling walker (2 wheeled) Transfers: Sit to/from Stand Sit to Stand: Min assist Stand pivot transfers: Min assist;Mod assist       General transfer comment: multimodal cues to sstand at Rw, cues to place robe on self.   Ambulation/Gait Ambulation/Gait assistance: Mod assist Gait Distance (Feet): 40 Feet Assistive device: Rolling walker (2 wheeled) Gait Pattern/deviations: Step-to pattern;Staggering left;Staggering right     General Gait Details: required  assistance for guiding RW, patient with increased Respirations. Stated" I need my breath back". Sats 97% on RA.   Stairs             Wheelchair Mobility    Modified Rankin (Stroke Patients Only)       Balance Overall balance  assessment: Needs assistance;History of Falls Sitting-balance support: No upper extremity supported;Feet supported Sitting balance-Leahy Scale: Good     Standing balance support: During functional activity;Single extremity supported Standing balance-Leahy Scale: Poor Standing balance comment: relies on UE support                            Cognition Arousal/Alertness: Awake/alert Behavior During Therapy: WFL for tasks assessed/performed Overall Cognitive Status: Impaired/Different from baseline Area of Impairment: Following commands                       Following Commands: Follows one step commands consistently;Follows one step commands with increased time       General Comments: patient follows commands, is HOH, relies to daughter for direction at times. at times was perseverated on drinking coffee.      Exercises      General Comments        Pertinent Vitals/Pain Pain Assessment: Faces Faces Pain Scale: Hurts even more Pain Location: all over- not able to be specific Pain Intervention(s): Monitored during session    Home Living                 Additional Comments: per daughter, transition to ALF    Prior Function Level of Independence: Needs assistance  Gait / Transfers Assistance Needed: until recently, ambulated independently, started  using cane due to holding to things in apartment.       PT Goals (current goals can now be found in the care plan section) Acute  Rehab PT Goals Patient Stated Goal: to go to ALF PT Goal Formulation: With family Time For Goal Achievement: 03/10/18 Potential to Achieve Goals: Fair Progress towards PT goals: Progressing toward goals    Frequency    Min 2X/week      PT Plan Current plan remains appropriate    Co-evaluation              AM-PAC PT "6 Clicks" Daily Activity  Outcome Measure  Difficulty turning over in bed (including adjusting bedclothes, sheets and blankets)?: A  Little Difficulty moving from lying on back to sitting on the side of the bed? : A Little Difficulty sitting down on and standing up from a chair with arms (e.g., wheelchair, bedside commode, etc,.)?: Unable Help needed moving to and from a bed to chair (including a wheelchair)?: Total Help needed walking in hospital room?: Total Help needed climbing 3-5 steps with a railing? : Total 6 Click Score: 10    End of Session Equipment Utilized During Treatment: Gait belt Activity Tolerance: Patient limited by fatigue Patient left: in chair;with call bell/phone within reach;with chair alarm set;with family/visitor present Nurse Communication: Mobility status PT Visit Diagnosis: Unsteadiness on feet (R26.81);History of falling (Z91.81)     Time: 4388-8757 PT Time Calculation (min) (ACUTE ONLY): 12 min  Charges:  $Gait Training: 8-22 mins                     Elias-Fela Solis Pager [phone] Office [phone]    [name] 02/24/2018, 9:28 AM

## 2018-02-24 NOTE — Evaluation (Addendum)
Clinical/Bedside Swallow Evaluation Patient Details  Name: Cynthia Shepard MRN: [id number] Date of Birth: Sep 03, 1919  Today's Date: 02/24/2018 Time: SLP Start Time (ACUTE ONLY): 62 SLP Stop Time (ACUTE ONLY): 1605 SLP Time Calculation (min) (ACUTE ONLY): 25 min  Past Medical History:  Past Medical History:  Diagnosis Date  . Alzheimer's disease ([place])   . Anemia, unspecified   . DVT of popliteal vein ([place]) 02/10/2018   Acute:  Right popliteal vein, posterior tibial vein, and gastrocnemius veins.  . Dyspepsia and other specified disorders of function of stomach   . Dysphagia, unspecified(787.20)   . Dysrhythmia    Bradycardia, 1st degree AV block.  Dr. Einar Gip considered doing a pacemaker but he and pt decided risk>benefit at that time  . Edema   . GAD (generalized anxiety disorder)   . GERD (gastroesophageal reflux disease)   . Hyperlipidemia   . Hypertension   . Internal hemorrhoids without mention of complication   . Neoplasm of brain causing mass effect on adjacent structures ([place]) 02/16/2018   6 cm mass c/w meningioma; with mass effect on frontal and temporal lobes, and significant midline shift L to R of 15mm, vasogenic edema extending posteriorly.  . Nonmelanoma skin cancer    SCC  L side of nose (Dr. Denna Haggard).  . Osteoarthrosis, unspecified whether generalized or localized, unspecified site   . Osteoporosis, unspecified   . Restless legs syndrome (RLS)   . Unspecified prolapse of vaginal walls   . Unspecified vitamin D deficiency   . Urinary frequency    Past Surgical History:  Past Surgical History:  Procedure Laterality Date  . ABDOMINAL HYSTERECTOMY  2011  . APPENDECTOMY  1972  . ESOPHAGEAL DILATION     Multiple times.  Eventually got botox injections and this helped a lot (as of 03/2016).  . ESOPHAGOGASTRODUODENOSCOPY (EGD) WITH PROPOFOL  05/17/2012   Procedure: ESOPHAGOGASTRODUODENOSCOPY (EGD) WITH PROPOFOL;  Surgeon: Garlan Fair, MD;  Location: [facility]  ENDOSCOPY;  Service: Endoscopy;  Laterality: N/A;  . EYE SURGERY     catarct extraction bilateral  . THYROID SURGERY  1954   HPI:  84 year old female admitted 02/23/18 after a fall at home. PMH: dementia, meningioam, DVT, dysphagia, GERD. CXR = Some increasing interstitial opacities at the left lung base c/w PNA.  Assessment / Plan / Recommendation Clinical Impression  Pt presents with adequate oral motor strength and function. Family reports history of esophageal dysmotility with dilation in the past. Pt was observed eating soft solids and drinking thin liquids. No obvious oral deficits, and no overt s/s aspiration observed following po trials. Several minutes after po trials had ended, pt exhibited slight cough, raising suspicion for esophageal issues. Recommend consideration of objective study via MBS or esophageal work up to determine if esophageal dysphagia is causing this cough/congestion, if within family wishes. SLP answered questions regarding each study. Family indicated they would like to have this evening to discuss these options and decide later if they want to proceed with either test. No changes will be made in pt's diet at this time, to allow full range of po choices. Family reports poor intake, and pt willingness to eat limited foods. Downgrading diet will restrict choices. SLP will follow up tomorrow and proceed per family wishes.    SLP Visit Diagnosis: Dysphagia, unspecified (R13.10)    Aspiration Risk  Mild aspiration risk    Diet Recommendation Regular;Thin liquid   Liquid Administration via: Straw;Cup Medication Administration: (as tolerated3) Supervision: Patient able to self feed  Compensations: Minimize environmental distractions;Slow rate;Small sips/bites Postural Changes: Seated upright at 90 degrees;Remain upright for at least 30 minutes after po intake    Other  Recommendations Recommended Consults: Consider esophageal assessment Oral Care Recommendations: Oral  care QID   Follow up Recommendations (TBD)      Frequency and Duration min 1 x/week  1 week       Prognosis Prognosis for Safe Diet Advancement: Fair Barriers to Reach Goals: Cognitive deficits(advanced age)      Cynthia Shepard Study   General Date of Onset: 02/23/18 HPI: 84 year old female admitted 02/23/18 after a fall at home. PMH: dementia, meningioam, DVT, dysphagia, GERD. CXR = Some increasing interstitial opacities at the left lung base Type of Study: Bedside Swallow Evaluation Previous Swallow Assessment: none Diet Prior to this Study: Regular;Thin liquids Temperature Spikes Noted: No Respiratory Status: Room air History of Recent Intubation: No Behavior/Cognition: Cooperative;Pleasant mood;Alert;Confused;Distractible;Requires cueing Oral Cavity Assessment: Within Functional Limits Oral Care Completed by SLP: No Oral Cavity - Dentition: Adequate natural dentition Vision: Functional for self-feeding Self-Feeding Abilities: Able to feed self Patient Positioning: Upright in bed Baseline Vocal Quality: Normal Volitional Cough: Cognitively unable to elicit Volitional Swallow: Unable to elicit    Oral/Motor/Sensory Function Overall Oral Motor/Sensory Function: Within functional limits   Ice Chips Ice chips: Not tested   Thin Liquid Thin Liquid: Within functional limits Presentation: Straw    Nectar Thick Nectar Thick Liquid: Not tested   Honey Thick Honey Thick Liquid: Not tested   Puree Puree: Not tested   Solid     Solid: Within functional limits Presentation: Self Fed     Cynthia Shepard, [facility], [place] Speech Language Pathologist [phone]  Shonna Chock 02/24/2018,4:11 PM

## 2018-02-24 NOTE — Progress Notes (Signed)
Palliative care brief note  Met today with patient and daughter.  Family would like to pursue trial of rehab to see how much functional status she can regain.  Recommended completion of MOST prior to discharge.  Will f/u with family again tomorrow.  Full consult to follow.  Micheline Rough, MD [place] [facility] Team [phone]

## 2018-02-25 ENCOUNTER — Ambulatory Visit: Payer: Medicare Other | Admitting: Family Medicine

## 2018-02-25 DIAGNOSIS — Z7189 Other specified counseling: Secondary | ICD-10-CM | POA: Diagnosis not present

## 2018-02-25 DIAGNOSIS — Z515 Encounter for palliative care: Secondary | ICD-10-CM

## 2018-02-25 DIAGNOSIS — M25531 Pain in right wrist: Secondary | ICD-10-CM | POA: Diagnosis not present

## 2018-02-25 DIAGNOSIS — D329 Benign neoplasm of meninges, unspecified: Secondary | ICD-10-CM | POA: Diagnosis not present

## 2018-02-25 DIAGNOSIS — F0390 Unspecified dementia without behavioral disturbance: Secondary | ICD-10-CM | POA: Diagnosis not present

## 2018-02-25 LAB — CBC WITH DIFFERENTIAL/PLATELET
Abs Immature Granulocytes: 0.03 10*3/uL (ref 0.00–0.07)
BASOS ABS: 0 10*3/uL (ref 0.0–0.1)
Basophils Relative: 0 %
EOS PCT: 0 %
Eosinophils Absolute: 0 10*3/uL (ref 0.0–0.5)
HEMATOCRIT: 38.3 % (ref 36.0–46.0)
HEMOGLOBIN: 12.3 g/dL (ref 12.0–15.0)
IMMATURE GRANULOCYTES: 1 %
Lymphocytes Relative: 11 %
Lymphs Abs: 0.5 10*3/uL — ABNORMAL LOW (ref 0.7–4.0)
MCH: 31.5 pg (ref 26.0–34.0)
MCHC: 32.1 g/dL (ref 30.0–36.0)
MCV: 98 fL (ref 80.0–100.0)
Monocytes Absolute: 0.1 10*3/uL (ref 0.1–1.0)
Monocytes Relative: 2 %
NRBC: 0 % (ref 0.0–0.2)
Neutro Abs: 4 10*3/uL (ref 1.7–7.7)
Neutrophils Relative %: 86 %
Platelets: 202 10*3/uL (ref 150–400)
RBC: 3.91 MIL/uL (ref 3.87–5.11)
RDW: 12.7 % (ref 11.5–15.5)
WBC: 4.6 10*3/uL (ref 4.0–10.5)

## 2018-02-25 LAB — URINE CULTURE

## 2018-02-25 LAB — BASIC METABOLIC PANEL
ANION GAP: 8 (ref 5–15)
BUN: 14 mg/dL (ref 8–23)
CHLORIDE: 108 mmol/L (ref 98–111)
CO2: 23 mmol/L (ref 22–32)
Calcium: 9.4 mg/dL (ref 8.9–10.3)
Creatinine, Ser: 0.68 mg/dL (ref 0.44–1.00)
GFR calc non Af Amer: >60 mL/min (ref >60)
Glucose, Bld: 142 mg/dL — ABNORMAL HIGH (ref 70–99)
POTASSIUM: 4.5 mmol/L (ref 3.5–5.1)
Sodium: 139 mmol/L (ref 135–145)

## 2018-02-25 MED ORDER — GUAIFENESIN-DM 100-10 MG/5ML PO SYRP
5.0000 mL | ORAL_SOLUTION | Freq: Four times a day (QID) | ORAL | Status: DC
  Administered 2018-02-25 – 2018-02-28 (×11): 5 mL via ORAL
  Filled 2018-02-25 (×11): qty 10

## 2018-02-25 NOTE — Progress Notes (Signed)
Physical Therapy Treatment Patient Details Name: Cynthia Shepard MRN: [id number] DOB: August 20, 1919 Today's Date: 02/25/2018    History of Present Illness 84 yo female admitted 02/23/18 after fall at  independent senior apartment, increased difficulty with expressive speech. Patient has H/O dementia,,  recent Right DVT, 02/16/18 CT demonstrated meningioma with shift.     PT Comments    The patient is noted to have improved in mobility and ambulation. Patient also is calmer, no perseveration and is noted to have improved some with expression. Continue PT.  Follow Up Recommendations  SNF     Equipment Recommendations  None recommended by PT    Recommendations for Other Services       Precautions / Restrictions Precautions Precautions: Fall    Mobility  Bed Mobility   Bed Mobility: Supine to Sit;Sit to Supine     Supine to sit: Supervision Sit to supine: Supervision      Transfers Overall transfer level: Needs assistance Equipment used: Rolling walker (2 wheeled) Transfers: Sit to/from Stand Sit to Stand: Min guard         General transfer comment: patient  improved for standing from  bed and from toilet.  Ambulation/Gait Ambulation/Gait assistance: Min assist Gait Distance (Feet): 60 Feet Assistive device: Rolling walker (2 wheeled) Gait Pattern/deviations: Step-through pattern     General Gait Details: gait is  improved today with RW. Patient with less SOB. Still noted to pant at times.    Stairs             Wheelchair Mobility    Modified Rankin (Stroke Patients Only)       Balance Overall balance assessment: Needs assistance;History of Falls Sitting-balance support: No upper extremity supported;Feet supported       Standing balance support: During functional activity;No upper extremity supported Standing balance-Leahy Scale: Fair Standing balance comment: while washing hands at sink                            Cognition  Arousal/Alertness: Awake/alert   Overall Cognitive Status: Impaired/Different from baseline Area of Impairment: Following commands                               General Comments: patient follows commands, is HOH, HA battery placed, Patiwnt follows directions better today. Also improved expressive  efforts      Exercises      General Comments        Pertinent Vitals/Pain Pain Assessment: No/denies pain    Home Living                      Prior Function            PT Goals (current goals can now be found in the care plan section) Progress towards PT goals: Progressing toward goals    Frequency    Min 2X/week      PT Plan Discharge plan needs to be updated    Co-evaluation              AM-PAC PT "6 Clicks" Daily Activity  Outcome Measure  Difficulty turning over in bed (including adjusting bedclothes, sheets and blankets)?: A Little Difficulty moving from lying on back to sitting on the side of the bed? : A Little Difficulty sitting down on and standing up from a chair with arms (e.g., wheelchair, bedside commode, etc,.)?: A Little Help  needed moving to and from a bed to chair (including a wheelchair)?: A Lot Help needed walking in hospital room?: A Lot Help needed climbing 3-5 steps with a railing? : Total 6 Click Score: 14    End of Session Equipment Utilized During Treatment: Gait belt Activity Tolerance: Patient tolerated treatment well Patient left: in bed;with call bell/phone within reach;with bed alarm set;with family/visitor present Nurse Communication: Mobility status PT Visit Diagnosis: Unsteadiness on feet (R26.81);History of falling (Z91.81)     Time: 0404-5913 PT Time Calculation (min) (ACUTE ONLY): 20 min  Charges:  $Gait Training: 8-22 mins                     [place] Pager [phone] Office [phone]'    Cynthia Shepard 02/25/2018, 2:55 PM

## 2018-02-25 NOTE — Progress Notes (Signed)
Palliative care progress note   I met with patient's daughter. We reviewed a MOST form and discussed how to develop plan of care to focus on continuing therapies that would maximize chance of being well enough to return home and limiting therapies not in line with this goal.  We completed MOST form today. DNR, Limited additional interventions, IVF and ABX if indicated, no feeding tube.  Discussed SLP findings and consideration for further studies.  Family would like to continue with current diet and declined further testing.  Plan for transition to [facility] when bed available.  Total time: 45 minutes Greater than 50%  of this time was spent counseling and coordinating care related to the above assessment and plan.   Micheline Rough, MD [place] Team [phone]

## 2018-02-25 NOTE — Care Management Obs Status (Signed)
[facility] NOTIFICATION   Patient Details  Name: Cynthia Shepard MRN: [id number] Date of Birth: 1920-04-26   Medicare Observation Status Notification Given:       Lynnell Catalan, RN 02/25/2018, 11:40 AM

## 2018-02-25 NOTE — Progress Notes (Signed)
Pt's family selects [facility] SN[facility]. CSW confirmed acceptance with admissions and facility initiated [facility] insurance authorization.   Sharren Bridge, MSW, LCSW Clinical Social Work 02/25/2018 [phone]

## 2018-02-25 NOTE — Progress Notes (Signed)
  Speech Language Pathology  Patient Details Name: Cynthia Shepard MRN: [id number] DOB: Apr 07, 1920 Today's Date: 02/25/2018 Time: 4536-     No charge for visit/session. Pt in bathroom, briefly spoke with pt's daughter who reported pt's appetite much better today; mild amount of coughing. Pt has phlegm and discussed "reflux cough" if present. Family spoke last night and do not wish to pursue further testing. SLP reiterated esophageal precautions with dtr. ST will sign off.              GO                Houston Siren 02/25/2018, 4:01 PM Lattie Haw [place] Nick M.Ed Risk analyst [phone] Office [phone]

## 2018-02-25 NOTE — Progress Notes (Signed)
PROGRESS NOTE    Cynthia Shepard  WPY:[id number] DOB: 1920/04/15 DOA: 02/23/2018 PCP: [name], MD   Brief Narrative: Patient is a 84 year old female with past medical history significant for dementia, recently diagnosed meningioma who presented with a fall in her apartment.  She lives in senior housing without any assistance.  CT head done on presentation showed large meningioma along the left frontal temporal convexity with vasogenic edema and 11 mm midline shift.  Assessment & Plan:   Principal Problem:   Meningioma [facility]) Active Problems:   Hypokalemia   Dementia without behavioral disturbance ([place])   Fall at home, initial encounter  Meningioma: Recently diagnosed.  Follows with neurosurgery Dr.Nandkman.    Patient and family do not want any intervention .  CT finding as above.  Started on Decadron which we will continue for now. Denies any headache, nausea or vomiting.Palliative care following for goals of care discussion.  Hypokalemia: Supplemented with potassium and corrected.  [place] at [place].  Imagings and CT head did not show any fracture dislocation, no acute intracranial abnormalities.  PT evaluated her and recommended SNF.  Social worker consulted.  Dementia: Continue supportive care.  Dysphagia: She has long history of esophageal dysmotility issues.  As per discussion with the daughter, she might have a history of achalasia.She continues to have  some cough.  Continue Mucinex.  Speech therapy following.  Current recommendation is regular diet, thin liquid.  Family not interested on further work-up. Chest x-ray done on presentation showed some increasing interstitial opacities at the left lung base since 02/23/2014.  Less suspicious for pneumonia.     DVT prophylaxis: SCD Code Status: DNR Family Communication: Daughter present at the bedside Disposition Plan: SNF as soon as bed is available   Consultants: None  Procedures: None  Antimicrobials:  None  Subjective: Patient seen and examined the bedside this morning.  She denies any shortness of breath or chest pain, nausea, vomiting or headache.  Hemodynamically stable.Continues to cough though.  Objective: Vitals:   02/24/18 0453 02/24/18 1323 02/24/18 2132 02/25/18 0601  BP: (!) 125/55 132/63 140/69 133/62  Pulse: 65 60 67 71  Resp: 18 15 18 16   Temp: 97.9 F (36.6 C) 98 F (36.7 C) 97.9 F (36.6 C) 98.1 F (36.7 C)  TempSrc: Oral Oral Oral Oral  SpO2: 97% 100% 97% 100%    Intake/Output Summary (Last 24 hours) at 02/25/2018 1150 Last data filed at 02/25/2018 0606 Gross per 24 hour  Intake 780 ml  Output -  Net 780 ml   There were no vitals filed for this visit.  Examination:  General exam: Appears calm and comfortable ,Not in distress,thin Built,frail elderly female HEENT:PERRL,Oral mucosa moist, Ear/Nose normal on gross exam Respiratory system: Bilateral decreased air entry on the bases Cardiovascular system: S1 & S2 heard, RRR. No JVD, murmurs, rubs, gallops or clicks. No pedal edema. Gastrointestinal system: Abdomen is nondistended, soft and nontender. No organomegaly or masses felt. Normal bowel sounds heard. Central nervous system: Alert and oriented. No focal neurological deficits. Extremities: No edema, no clubbing ,no cyanosis, distal peripheral pulses palpable. Skin: No rashes, lesions or ulcers,no icterus ,no pallor   Data Reviewed: I have personally reviewed following labs and imaging studies  CBC: Recent Labs  Lab 02/23/18 1119 02/25/18 0509  WBC 9.3 4.6  NEUTROABS 8.0* 4.0  HGB 13.7 12.3  HCT 42.1 38.3  MCV 97.5 98.0  PLT 206 053   Basic Metabolic Panel: Recent Labs  Lab 02/23/18 1119  02/24/18 1005 02/25/18 0509  NA 140 142 139  K 3.0* 4.0 4.5  CL 104 110 108  CO2 26 24 23   GLUCOSE 114* 113* 142*  BUN 17 14 14   CREATININE 0.85 0.79 0.68  CALCIUM 9.8 9.8 9.4   GFR: CrCl cannot be calculated (Unknown ideal weight.). Liver  Function Tests: Recent Labs  Lab 02/23/18 1119  AST 38  ALT 17  ALKPHOS 57  BILITOT 1.3*  PROT 7.8  ALBUMIN 3.6   No results for input(s): LIPASE, AMYLASE in the last 168 hours. No results for input(s): AMMONIA in the last 168 hours. Coagulation Profile: No results for input(s): INR, PROTIME in the last 168 hours. Cardiac Enzymes: Recent Labs  Lab 02/23/18 1119  CKTOTAL 677*   BNP (last 3 results) No results for input(s): PROBNP in the last 8760 hours. HbA1C: No results for input(s): HGBA1C in the last 72 hours. CBG: No results for input(s): GLUCAP in the last 168 hours. Lipid Profile: No results for input(s): CHOL, HDL, LDLCALC, TRIG, CHOLHDL, LDLDIRECT in the last 72 hours. Thyroid Function Tests: No results for input(s): TSH, T4TOTAL, FREET4, T3FREE, THYROIDAB in the last 72 hours. Anemia Panel: No results for input(s): VITAMINB12, FOLATE, FERRITIN, TIBC, IRON, RETICCTPCT in the last 72 hours. Sepsis Labs: No results for input(s): PROCALCITON, LATICACIDVEN in the last 168 hours.  Recent Results (from the past 240 hour(s))  Urine culture     Status: Abnormal   Collection Time: 02/24/18  2:39 AM  Result Value Ref Range Status   Specimen Description   Final    URINE, RANDOM Performed at [facility] [address] [place], [place] [address]    Special Requests   Final    NONE Performed at [facility], [place] [address], [place], [place] [address]    Culture MULTIPLE SPECIES PRESENT, SUGGEST RECOLLECTION (A)  Final   Report Status 02/25/2018 FINAL  Final         Radiology Studies: Dg Chest 2 View  Result Date: 02/23/2018 CLINICAL DATA:  fell last night; she says that the pt was on the all night and her right hand is swollen; Joann also says that the pt can not walk Patient not able to communicate well, redness anterior knee and swelling right wrist EXAM: CHEST - 2 VIEW COMPARISON:  02/23/2014 FINDINGS: Increasing  interstitial opacity in the left perihilar region and left lower lung. Stable cardiomegaly.  Aortic Atherosclerosis (ICD10-170.0). No effusion. Spondylitic changes in the lower thoracic and lumbar spine. IMPRESSION: 1. Some increasing interstitial opacities at the left lung base since 02/23/2014. 2. Stable cardiomegaly Electronically Signed   By: Lucrezia Europe M.D.   On: 02/23/2018 12:34   Dg Wrist Complete Right  Result Date: 02/23/2018 CLINICAL DATA:  fell last night; she says that the pt was on the all night and her right hand is swollen; Joann also says that the pt can not walk Patient not able to communicate well, redness anterior knee and swelling right wrist EXAM: RIGHT WRIST - COMPLETE 3+ VIEW COMPARISON:  None. FINDINGS: Diffuse osteopenia. No acute fracture. Normal alignment. Advanced cartilage loss with subchondral sclerosis and some regional fragmentation at the first carpometacarpal articulation. IMPRESSION: 1. No acute findings. 2. Osteopenia with advanced first [place] DJD. Electronically Signed   By: Lucrezia Europe M.D.   On: 02/23/2018 12:29   Dg Knee Complete 4 Views Left  Result Date: 02/23/2018 CLINICAL DATA:  fell last night; she says that the pt was  on the all night and her right hand is swollen; Joann also says that the pt can not walk Patient not able to communicate well, redness anterior knee and swelling right wrist EXAM: LEFT KNEE - COMPLETE 4+ VIEW COMPARISON:  None. FINDINGS: No evidence of fracture, dislocation, or joint effusion. Early marginal spurs from the patellar articular surface, lateral femoral condyle, and tibial plateau. Patchy femoral-popliteal arterial calcifications. Soft tissues are unremarkable. IMPRESSION: 1. Negative for fracture or other acute finding. 2. Degenerative changes as above. Electronically Signed   By: Lucrezia Europe M.D.   On: 02/23/2018 12:30        Scheduled Meds: . dexamethasone  4 mg Oral Q8H  . enoxaparin (LOVENOX) injection  30 mg Subcutaneous  Q24H  . guaiFENesin  600 mg Oral BID   Continuous Infusions:   LOS: 0 days    Time spent: 25 mins.More than 50% of that time was spent in counseling and/or coordination of care.      Shelly Coss, MD [facility] Pager [phone]  If 7PM-7AM, please contact night-coverage [email or web address] Password [email or web address] 02/25/2018, 11:50 AM

## 2018-02-26 DIAGNOSIS — M25531 Pain in right wrist: Secondary | ICD-10-CM | POA: Diagnosis not present

## 2018-02-26 DIAGNOSIS — D329 Benign neoplasm of meninges, unspecified: Secondary | ICD-10-CM | POA: Diagnosis not present

## 2018-02-26 MED ORDER — POLYETHYLENE GLYCOL 3350 17 G PO PACK
17.0000 g | PACK | Freq: Every day | ORAL | Status: DC
  Administered 2018-02-26 – 2018-02-27 (×2): 17 g via ORAL
  Filled 2018-02-26 (×2): qty 1

## 2018-02-26 NOTE — Progress Notes (Signed)
PROGRESS NOTE    Cynthia Shepard  ZJI:[id number] DOB: 1920-02-24 DOA: 02/23/2018 PCP: [name], MD   Brief Narrative: Patient is a 84 year old female with past medical history significant for dementia, recently diagnosed meningioma who presented with a fall in her apartment.  She lives in senior housing without any assistance.  CT head done on presentation showed large meningioma along the left frontal temporal convexity with vasogenic edema and 11 mm midline shift. She is currently waiting to be discharged to skilled nursing facility.  Assessment & Plan:   Principal Problem:   Meningioma [facility]) Active Problems:   Hypokalemia   Dementia without behavioral disturbance ([place])   Fall at home, initial encounter  Meningioma: Recently diagnosed.  Follows with neurosurgery Dr.Nandkman.    Patient and family do not want any intervention .  CT finding as above.  Started on Decadron which we will continue for now.She can follow up with her neurosurgeon who can decide on either tapering or stopping the Decadron in the near future.  She denies any headache, nausea or vomiting.Palliative care was also following for goals of care discussion.  Hypokalemia: Supplemented with potassium and corrected.  [place] at home.  Imagings and CT head did not show any fracture dislocation, no acute intracranial abnormalities.  PT evaluated her and recommended SNF.  Social worker consulted.Waiting for authorization and most likely this will happen on Monday.  Dementia: Continue supportive care.  Dysphagia: She has long history of esophageal dysmotility issues.  As per discussion with the daughter, she has history of possible achalasia.She was having  some cough which has improved with Robitussin.  Speech therapy was following.  Current recommendation is regular diet, thin liquid.  Family not interested on further work-up. Chest x-ray done on presentation showed some increasing interstitial opacities at  the left lung base since 02/23/2014.  Less suspicious for pneumonia.   DVT prophylaxis: SCD Code Status: DNR Family Communication: With daughter yesterday Disposition Plan: SNF as soon as bed is available   Consultants: None  Procedures: None  Antimicrobials: None  Subjective: Patient seen and examined the bedside this morning.  She denies any shortness of breath or chest pain, nausea, vomiting or headache.  Hemodynamically stable.her cough has significantly improved this morning.  She was sitting on the chair and looks comfortable  Objective: Vitals:   02/25/18 0601 02/25/18 1309 02/25/18 1950 02/26/18 0404  BP: 133/62 (!) 179/74 138/72 124/76  Pulse: 71 (!) 57 60 72  Resp: 16 15 14 16   Temp: 98.1 F (36.7 C) 97.7 F (36.5 C) 97.8 F (36.6 C) 97.9 F (36.6 C)  TempSrc: Oral Oral Oral Oral  SpO2: 100% 99% 100% 99%    Intake/Output Summary (Last 24 hours) at 02/26/2018 1109 Last data filed at 02/26/2018 1023 Gross per 24 hour  Intake 840 ml  Output -  Net 840 ml   There were no vitals filed for this visit.  Examination:  General exam: Appears calm and comfortable ,Not in distress,thin Built,frail elderly female HEENT:PERRL,Oral mucosa moist, Ear/Nose normal on gross exam Respiratory system: Bilateral decreased air entry on the bases otherwise clear Cardiovascular system: S1 & S2 heard, RRR. No JVD, murmurs, rubs, gallops or clicks. No pedal edema. Gastrointestinal system: Abdomen is nondistended, soft and nontender. No organomegaly or masses felt. Normal bowel sounds heard. Central nervous system: Alert and oriented.  Extremities: No edema, no clubbing ,no cyanosis, distal peripheral pulses palpable. Skin: No rashes, lesions or ulcers,no icterus ,no pallor   Data  Reviewed: I have personally reviewed following labs and imaging studies  CBC: Recent Labs  Lab 02/23/18 1119 02/25/18 0509  WBC 9.3 4.6  NEUTROABS 8.0* 4.0  HGB 13.7 12.3  HCT 42.1 38.3  MCV  97.5 98.0  PLT 206 299   Basic Metabolic Panel: Recent Labs  Lab 02/23/18 1119 02/24/18 1005 02/25/18 0509  NA 140 142 139  K 3.0* 4.0 4.5  CL 104 110 108  CO2 26 24 23   GLUCOSE 114* 113* 142*  BUN 17 14 14   CREATININE 0.85 0.79 0.68  CALCIUM 9.8 9.8 9.4   GFR: CrCl cannot be calculated (Unknown ideal weight.). Liver Function Tests: Recent Labs  Lab 02/23/18 1119  AST 38  ALT 17  ALKPHOS 57  BILITOT 1.3*  PROT 7.8  ALBUMIN 3.6   No results for input(s): LIPASE, AMYLASE in the last 168 hours. No results for input(s): AMMONIA in the last 168 hours. Coagulation Profile: No results for input(s): INR, PROTIME in the last 168 hours. Cardiac Enzymes: Recent Labs  Lab 02/23/18 1119  CKTOTAL 677*   BNP (last 3 results) No results for input(s): PROBNP in the last 8760 hours. HbA1C: No results for input(s): HGBA1C in the last 72 hours. CBG: No results for input(s): GLUCAP in the last 168 hours. Lipid Profile: No results for input(s): CHOL, HDL, LDLCALC, TRIG, CHOLHDL, LDLDIRECT in the last 72 hours. Thyroid Function Tests: No results for input(s): TSH, T4TOTAL, FREET4, T3FREE, THYROIDAB in the last 72 hours. Anemia Panel: No results for input(s): VITAMINB12, FOLATE, FERRITIN, TIBC, IRON, RETICCTPCT in the last 72 hours. Sepsis Labs: No results for input(s): PROCALCITON, LATICACIDVEN in the last 168 hours.  Recent Results (from the past 240 hour(s))  Urine culture     Status: Abnormal   Collection Time: 02/24/18  2:39 AM  Result Value Ref Range Status   Specimen Description   Final    URINE, RANDOM Performed at [facility] [address], [place], [place] [address]    Special Requests   Final    NONE Performed at [facility], [place] [address] [place], [place] [address]    Culture MULTIPLE SPECIES PRESENT, SUGGEST RECOLLECTION (A)  Final   Report Status 02/25/2018 FINAL  Final         Radiology Studies: No results  found.      Scheduled Meds: . dexamethasone  4 mg Oral Q8H  . enoxaparin (LOVENOX) injection  30 mg Subcutaneous Q24H  . guaiFENesin-dextromethorphan  5 mL Oral Q6H   Continuous Infusions:   LOS: 0 days    Time spent: 25 mins.More than 50% of that time was spent in counseling and/or coordination of care.      Shelly Coss, MD [facility] Pager [phone]  If 7PM-7AM, please contact night-coverage [email or web address] Password [email or web address] 02/26/2018, 11:09 AM

## 2018-02-27 DIAGNOSIS — F0390 Unspecified dementia without behavioral disturbance: Secondary | ICD-10-CM

## 2018-02-27 DIAGNOSIS — W19XXXA Unspecified fall, initial encounter: Secondary | ICD-10-CM | POA: Diagnosis not present

## 2018-02-27 DIAGNOSIS — G9389 Other specified disorders of brain: Secondary | ICD-10-CM

## 2018-02-27 DIAGNOSIS — M25531 Pain in right wrist: Secondary | ICD-10-CM | POA: Diagnosis not present

## 2018-02-27 DIAGNOSIS — D329 Benign neoplasm of meninges, unspecified: Secondary | ICD-10-CM

## 2018-02-27 DIAGNOSIS — E876 Hypokalemia: Secondary | ICD-10-CM | POA: Diagnosis not present

## 2018-02-27 LAB — BASIC METABOLIC PANEL
ANION GAP: 7 (ref 5–15)
BUN: 15 mg/dL (ref 8–23)
CO2: 26 mmol/L (ref 22–32)
Calcium: 9.2 mg/dL (ref 8.9–10.3)
Chloride: 107 mmol/L (ref 98–111)
Creatinine, Ser: 0.74 mg/dL (ref 0.44–1.00)
GFR calc Af Amer: >60 mL/min (ref >60)
Glucose, Bld: 113 mg/dL — ABNORMAL HIGH (ref 70–99)
POTASSIUM: 4.2 mmol/L (ref 3.5–5.1)
SODIUM: 140 mmol/L (ref 135–145)

## 2018-02-27 LAB — CBC WITH DIFFERENTIAL/PLATELET
ABS IMMATURE GRANULOCYTES: 0.03 10*3/uL (ref 0.00–0.07)
BASOS ABS: 0 10*3/uL (ref 0.0–0.1)
BASOS PCT: 0 %
Eosinophils Absolute: 0 10*3/uL (ref 0.0–0.5)
Eosinophils Relative: 0 %
HCT: 36.2 % (ref 36.0–46.0)
Hemoglobin: 11.4 g/dL — ABNORMAL LOW (ref 12.0–15.0)
IMMATURE GRANULOCYTES: 1 %
Lymphocytes Relative: 12 %
Lymphs Abs: 0.7 10*3/uL (ref 0.7–4.0)
MCH: 32 pg (ref 26.0–34.0)
MCHC: 31.5 g/dL (ref 30.0–36.0)
MCV: 101.7 fL — ABNORMAL HIGH (ref 80.0–100.0)
MONOS PCT: 9 %
Monocytes Absolute: 0.5 10*3/uL (ref 0.1–1.0)
NEUTROS ABS: 4.5 10*3/uL (ref 1.7–7.7)
NEUTROS PCT: 78 %
PLATELETS: 193 10*3/uL (ref 150–400)
RBC: 3.56 MIL/uL — AB (ref 3.87–5.11)
RDW: 12.5 % (ref 11.5–15.5)
WBC: 5.7 10*3/uL (ref 4.0–10.5)
nRBC: 0 % (ref 0.0–0.2)

## 2018-02-27 NOTE — Progress Notes (Signed)
PROGRESS NOTE  Cynthia Shepard GYK:[id number] DOB: May 26, 1919 DOA: 02/23/2018 PCP: [name], MD  HPI/Recap of past 24 hours: Patient is a 84 year old female with past medical history significant for dementia, recently diagnosed meningioma who presented with a fall in her apartment.  She lives in senior housing without any assistance.  CT head done on presentation showed large meningioma along the left frontal temporal convexity with vasogenic edema and 11 mm midline shift. She is currently waiting to be discharged to skilled nursing facility.  02/27/2018: Patient seen and examined with her daughter at bedside.  No acute events overnight.  She has no new complaints.  She is pleasantly demented.  PT assessed and recommended SNF.  Unsafe to discharge at the assisted living facility due to unsteadiness on her feet and high risk of fall.  Awaiting bed placement at SNF.  Assessment/Plan: Principal Problem:   Meningioma [facility]) Active Problems:   Hypokalemia   Dementia without behavioral disturbance ([place])   Fall at home, initial encounter  Large meningioma Independently reviewed CT head with no contrast which revealed large left meningioma with midline shift from left to right 11 mm. Diagnosed less than 2 months ago Follows with neurosurgery outpatient No plan for intervention Continue follow-up appointment with neurosurgery outpatient Continue Decadron  Ambulatory dysfunction status post fall PT assessed and recommended SNF Fall precautions Unsafe to discharge to assisted living facility due to high risk for recurrent falls Awaiting bed placement at SNF  Dementia Pleasantly demented Reorient as needed      Code Status: DNR  Family Communication: Daughter at bedside.  All questions answered to her satisfaction.  Disposition Plan: Possibly to SNF tomorrow 02/28/2018   Consultants:  Palliative care team  Procedures:  None  Antimicrobials:  None  DVT  prophylaxis: SCDs   Objective: Vitals:   02/26/18 1338 02/26/18 1446 02/26/18 2020 02/27/18 0446  BP: (!) 176/67 138/61 138/72 132/70  Pulse: (!) 54 (!) 58 70 82  Resp: 16  16 14   Temp: (!) 97.5 F (36.4 C)  97.6 F (36.4 C) 97.9 F (36.6 C)  TempSrc: Oral  Oral Oral  SpO2: 99%  97% 98%    Intake/Output Summary (Last 24 hours) at 02/27/2018 1350 Last data filed at 02/27/2018 0950 Gross per 24 hour  Intake 1060 ml  Output -  Net 1060 ml   There were no vitals filed for this visit.  Exam:  . General: 85 y.o. year-old female well developed well nourished in no acute distress.  Alert and pleasantly demented. . Cardiovascular: Regular rate and rhythm with no rubs or gallops.  No thyromegaly or JVD noted.   Marland Kitchen Respiratory: Clear to auscultation with no wheezes or rales. Good inspiratory effort. . Abdomen: Soft nontender nondistended with normal bowel sounds x4 quadrants. . Musculoskeletal: No lower extremity edema. 2/4 pulses in all 4 extremities. Marland Kitchen Psychiatry: Mood is appropriate for condition and setting   Data Reviewed: CBC: Recent Labs  Lab 02/23/18 1119 02/25/18 0509 02/27/18 0509  WBC 9.3 4.6 5.7  NEUTROABS 8.0* 4.0 4.5  HGB 13.7 12.3 11.4*  HCT 42.1 38.3 36.2  MCV 97.5 98.0 101.7*  PLT 206 202 793   Basic Metabolic Panel: Recent Labs  Lab 02/23/18 1119 02/24/18 1005 02/25/18 0509 02/27/18 0509  NA 140 142 139 140  K 3.0* 4.0 4.5 4.2  CL 104 110 108 107  CO2 26 24 23 26   GLUCOSE 114* 113* 142* 113*  BUN 17 14 14 15   CREATININE  0.85 0.79 0.68 0.74  CALCIUM 9.8 9.8 9.4 9.2   GFR: CrCl cannot be calculated (Unknown ideal weight.). Liver Function Tests: Recent Labs  Lab 02/23/18 1119  AST 38  ALT 17  ALKPHOS 57  BILITOT 1.3*  PROT 7.8  ALBUMIN 3.6   No results for input(s): LIPASE, AMYLASE in the last 168 hours. No results for input(s): AMMONIA in the last 168 hours. Coagulation Profile: No results for input(s): INR, PROTIME in the last  168 hours. Cardiac Enzymes: Recent Labs  Lab 02/23/18 1119  CKTOTAL 677*   BNP (last 3 results) No results for input(s): PROBNP in the last 8760 hours. HbA1C: No results for input(s): HGBA1C in the last 72 hours. CBG: No results for input(s): GLUCAP in the last 168 hours. Lipid Profile: No results for input(s): CHOL, HDL, LDLCALC, TRIG, CHOLHDL, LDLDIRECT in the last 72 hours. Thyroid Function Tests: No results for input(s): TSH, T4TOTAL, FREET4, T3FREE, THYROIDAB in the last 72 hours. Anemia Panel: No results for input(s): VITAMINB12, FOLATE, FERRITIN, TIBC, IRON, RETICCTPCT in the last 72 hours. Urine analysis:    Component Value Date/Time   COLORURINE YELLOW 02/24/2018 [place] 02/24/2018 0239   LABSPEC 1.009 02/24/2018 0239   PHURINE 5.0 02/24/2018 0239   GLUCOSEU NEGATIVE 02/24/2018 0239   HGBUR NEGATIVE 02/24/2018 0239   BILIRUBINUR NEGATIVE 02/24/2018 0239   KETONESUR 5 (A) 02/24/2018 0239   PROTEINUR NEGATIVE 02/24/2018 0239   NITRITE NEGATIVE 02/24/2018 0239   LEUKOCYTESUR LARGE (A) 02/24/2018 0239   Sepsis Labs: @LABRCNTIP (procalcitonin:4,lacticidven:4)  ) Recent Results (from the past 240 hour(s))  Urine culture     Status: Abnormal   Collection Time: 02/24/18  2:39 AM  Result Value Ref Range Status   Specimen Description   Final    URINE, RANDOM Performed at [facility], [place] [address], [place], [place] [address]    Special Requests   Final    NONE Performed at [facility], [place] [address], [place], [place] [address]    Culture MULTIPLE SPECIES PRESENT, SUGGEST RECOLLECTION (A)  Final   Report Status 02/25/2018 FINAL  Final      Studies: No results found.  Scheduled Meds: . dexamethasone  4 mg Oral Q8H  . enoxaparin (LOVENOX) injection  30 mg Subcutaneous Q24H  . guaiFENesin-dextromethorphan  5 mL Oral Q6H  . polyethylene glycol  17 g Oral Daily    Continuous Infusions:   LOS: 0 days      Kayleen Memos, MD [facility] Pager [phone]  If 7PM-7AM, please contact night-coverage [email or web address] Password [email or web address] 02/27/2018, 1:50 PM

## 2018-02-28 DIAGNOSIS — M6281 Muscle weakness (generalized): Secondary | ICD-10-CM | POA: Diagnosis not present

## 2018-02-28 DIAGNOSIS — R262 Difficulty in walking, not elsewhere classified: Secondary | ICD-10-CM | POA: Diagnosis not present

## 2018-02-28 DIAGNOSIS — Z7401 Bed confinement status: Secondary | ICD-10-CM | POA: Diagnosis not present

## 2018-02-28 DIAGNOSIS — R001 Bradycardia, unspecified: Secondary | ICD-10-CM | POA: Diagnosis not present

## 2018-02-28 DIAGNOSIS — R1312 Dysphagia, oropharyngeal phase: Secondary | ICD-10-CM | POA: Diagnosis not present

## 2018-02-28 DIAGNOSIS — D329 Benign neoplasm of meninges, unspecified: Secondary | ICD-10-CM | POA: Diagnosis not present

## 2018-02-28 DIAGNOSIS — I82401 Acute embolism and thrombosis of unspecified deep veins of right lower extremity: Secondary | ICD-10-CM | POA: Diagnosis not present

## 2018-02-28 DIAGNOSIS — K220 Achalasia of cardia: Secondary | ICD-10-CM | POA: Diagnosis not present

## 2018-02-28 DIAGNOSIS — D320 Benign neoplasm of cerebral meninges: Secondary | ICD-10-CM | POA: Diagnosis not present

## 2018-02-28 DIAGNOSIS — I69991 Dysphagia following unspecified cerebrovascular disease: Secondary | ICD-10-CM | POA: Diagnosis not present

## 2018-02-28 DIAGNOSIS — M25531 Pain in right wrist: Secondary | ICD-10-CM | POA: Diagnosis not present

## 2018-02-28 DIAGNOSIS — R5381 Other malaise: Secondary | ICD-10-CM | POA: Diagnosis not present

## 2018-02-28 DIAGNOSIS — G9389 Other specified disorders of brain: Secondary | ICD-10-CM | POA: Diagnosis not present

## 2018-02-28 DIAGNOSIS — M2550 Pain in unspecified joint: Secondary | ICD-10-CM | POA: Diagnosis not present

## 2018-02-28 DIAGNOSIS — M810 Age-related osteoporosis without current pathological fracture: Secondary | ICD-10-CM | POA: Diagnosis not present

## 2018-02-28 DIAGNOSIS — F0390 Unspecified dementia without behavioral disturbance: Secondary | ICD-10-CM | POA: Diagnosis not present

## 2018-02-28 DIAGNOSIS — Z85828 Personal history of other malignant neoplasm of skin: Secondary | ICD-10-CM | POA: Diagnosis not present

## 2018-02-28 DIAGNOSIS — J159 Unspecified bacterial pneumonia: Secondary | ICD-10-CM | POA: Diagnosis not present

## 2018-02-28 DIAGNOSIS — M25562 Pain in left knee: Secondary | ICD-10-CM | POA: Diagnosis not present

## 2018-02-28 DIAGNOSIS — D496 Neoplasm of unspecified behavior of brain: Secondary | ICD-10-CM | POA: Diagnosis not present

## 2018-02-28 DIAGNOSIS — M1990 Unspecified osteoarthritis, unspecified site: Secondary | ICD-10-CM | POA: Diagnosis not present

## 2018-02-28 DIAGNOSIS — K219 Gastro-esophageal reflux disease without esophagitis: Secondary | ICD-10-CM | POA: Diagnosis not present

## 2018-02-28 DIAGNOSIS — I824Z1 Acute embolism and thrombosis of unspecified deep veins of right distal lower extremity: Secondary | ICD-10-CM | POA: Diagnosis not present

## 2018-02-28 DIAGNOSIS — Z7982 Long term (current) use of aspirin: Secondary | ICD-10-CM | POA: Diagnosis not present

## 2018-02-28 DIAGNOSIS — R471 Dysarthria and anarthria: Secondary | ICD-10-CM | POA: Diagnosis not present

## 2018-02-28 DIAGNOSIS — G309 Alzheimer's disease, unspecified: Secondary | ICD-10-CM | POA: Diagnosis not present

## 2018-02-28 DIAGNOSIS — W19XXXA Unspecified fall, initial encounter: Secondary | ICD-10-CM | POA: Diagnosis not present

## 2018-02-28 DIAGNOSIS — R488 Other symbolic dysfunctions: Secondary | ICD-10-CM | POA: Diagnosis not present

## 2018-02-28 DIAGNOSIS — I10 Essential (primary) hypertension: Secondary | ICD-10-CM | POA: Diagnosis not present

## 2018-02-28 DIAGNOSIS — E876 Hypokalemia: Secondary | ICD-10-CM | POA: Diagnosis not present

## 2018-02-28 MED ORDER — DEXAMETHASONE 4 MG PO TABS: 4.0000 mg | ORAL_TABLET | Freq: Three times a day (TID) | ORAL | 0 refills | Status: AC

## 2018-02-28 MED ORDER — POLYETHYLENE GLYCOL 3350 17 G PO PACK: 17.0000 g | PACK | Freq: Every day | ORAL | 0 refills | Status: AC

## 2018-02-28 NOTE — Progress Notes (Signed)
Report called to Barnett Applebaum at [facility] that will receive the patient.  Patient stable from AM assessment.  Patient's daughter currently present as they await to be discharged to facility via [place].  Daughter updated that transportation had been called for patient.

## 2018-02-28 NOTE — Progress Notes (Signed)
Pt admitting to [facility] SNF [facility] hall- report [phone]  [facility] transportation arranged. Daughter at bedside agreeable to plan. Facility obtained approved [facility] Medicare authorization.  Dc information provided via the [place].  Sharren Bridge, MSW, LCSW Clinical Social Work 02/28/2018 [phone]

## 2018-02-28 NOTE — Discharge Summary (Signed)
Discharge Summary  Cynthia Shepard NFA:[id number] DOB: 07-06-1919  PCP: [name], MD  Admit date: 02/23/2018 Discharge date: 02/28/2018  Time spent: 35 minutes  Recommendations for Outpatient Follow-up:  1. Follow-up with your neurosurgeon 2. Follow-up with your PCP 3. Physical therapy as tolerated 4. Take your medications as prescribed  Discharge Diagnoses:  Active Hospital Problems   Diagnosis Date Noted  . Meningioma ([place]) 02/23/2018  . Fall at home, initial encounter 02/24/2018  . Hypokalemia 02/23/2018  . Dementia without behavioral disturbance ([place]) 02/23/2018    Resolved Hospital Problems  No resolved problems to display.    Discharge Condition: Stable  Diet recommendation: Resume previous diet  Vitals:   02/27/18 2006 02/28/18 0400  BP: 137/61 130/64  Pulse: 64 61  Resp: 16 14  Temp: (!) 97.5 F (36.4 C) 98.2 F (36.8 C)  SpO2: 98% 100%    History of present illness:  Patient is a 84 year old female with past medical history significant for dementia, recently diagnosed meningioma who presented with a fall in her apartment. She lives in senior housing without any assistance. CT head done on presentation showed large meningioma along the left frontal temporal convexity with vasogenic edema and 11 mm midline shift.  Hospital course complicated by intermittent uncontrolled hypertension which is now improved.  02/28/2018: Patient seen and examined at bedside.  She is pleasantly demented.  No acute events overnight.  No new complaints.  On the day of discharge, the patient was hemodynamically stable.  She will need to follow-up with neurosurgery and her PCP posthospitalization.  Hospital Course:  Principal Problem:   Meningioma [facility]) Active Problems:   Hypokalemia   Dementia without behavioral disturbance ([place])   Fall at home, initial encounter  Large meningioma Independently reviewed CT head with no contrast which revealed large left  meningioma with midline shift from left to right 11 mm. Diagnosed less than 2 months ago Follows with neurosurgery outpatient No plan for intervention Continue follow-up appointment with your neurosurgeon outpatient  Per the patient's daughter, she was being followed by a neurosurgeon outpatient Please call your neurosurgeon for a close follow-up appointment.   Continue Decadron  Ambulatory dysfunction status post fall PT assessed and recommended SNF Fall precautions  Dementia Pleasantly demented Reorient as needed     Procedures:  None  Consultations:  Palliative care team  Discharge Exam: BP 130/64 (BP Location: Right Arm)   Pulse 61   Temp 98.2 F (36.8 C) (Oral)   Resp 14   SpO2 100%  . General: 84 y.o. year-old female well developed well nourished in no acute distress.  Alert and oriented x3. . Cardiovascular: Regular rate and rhythm with no rubs or gallops.  No thyromegaly or JVD noted.   Marland Kitchen Respiratory: Clear to auscultation with no wheezes or rales. Good inspiratory effort. . Abdomen: Soft nontender nondistended with normal bowel sounds x4 quadrants. . Musculoskeletal: No lower extremity edema. 2/4 pulses in all 4 extremities. . Skin: No ulcerative lesions noted or rashes, . Psychiatry: Mood is appropriate for condition and setting  Discharge Instructions You were cared for by a hospitalist during your hospital stay. If you have any questions about your discharge medications or the care you received while you were in the hospital after you are discharged, you can call the unit and asked to speak with the hospitalist on call if the hospitalist that took care of you is not available. Once you are discharged, your primary care physician will handle any further medical issues.  Please note that NO REFILLS for any discharge medications will be authorized once you are discharged, as it is imperative that you return to your primary care physician (or establish a  relationship with a primary care physician if you do not have one) for your aftercare needs so that they can reassess your need for medications and monitor your lab values.   Allergies as of 02/28/2018   No Known Allergies     Medication List    STOP taking these medications   aspirin EC 81 MG tablet   citalopram 40 MG tablet Commonly known as:  CELEXA   pantoprazole 40 MG tablet Commonly known as:  PROTONIX   Vitamin D 2000 units Caps     TAKE these medications   clotrimazole-betamethasone cream Commonly known as:  LOTRISONE Apply 1 application topically 2 (two) times daily.   dexamethasone 4 MG tablet Commonly known as:  DECADRON Take 1 tablet (4 mg total) by mouth every 8 (eight) hours.   fluticasone 50 MCG/ACT nasal spray Commonly known as:  FLONASE INSTILL 2 SPRAYS IN EACH NOSTRIL EVERY DAY What changed:  See the new instructions.   polyethylene glycol packet Commonly known as:  MIRALAX / GLYCOLAX Take 17 g by mouth daily. Start taking on:  03/01/2018      No Known Allergies Follow-up Information    McGowen, Adrian Blackwater, MD. Call in 1 day(s).   Specialty:  Family Medicine Why:  Please call for post hospital follow-up appointment. Contact information: [address]  [address] [phone]            The results of significant diagnostics from this hospitalization (including imaging, microbiology, ancillary and laboratory) are listed below for reference.    Significant Diagnostic Studies: Dg Chest 2 View  Result Date: 02/23/2018 CLINICAL DATA:  fell last night; she says that the pt was on the all night and her right hand is swollen; Joann also says that the pt can not walk Patient not able to communicate well, redness anterior knee and swelling right wrist EXAM: CHEST - 2 VIEW COMPARISON:  02/23/2014 FINDINGS: Increasing interstitial opacity in the left perihilar region and left lower lung. Stable cardiomegaly.  Aortic Atherosclerosis  (ICD10-170.0). No effusion. Spondylitic changes in the lower thoracic and lumbar spine. IMPRESSION: 1. Some increasing interstitial opacities at the left lung base since 02/23/2014. 2. Stable cardiomegaly Electronically Signed   By: Lucrezia Europe M.D.   On: 02/23/2018 12:34   Dg Wrist Complete Right  Result Date: 02/23/2018 CLINICAL DATA:  fell last night; she says that the pt was on the all night and her right hand is swollen; Joann also says that the pt can not walk Patient not able to communicate well, redness anterior knee and swelling right wrist EXAM: RIGHT WRIST - COMPLETE 3+ VIEW COMPARISON:  None. FINDINGS: Diffuse osteopenia. No acute fracture. Normal alignment. Advanced cartilage loss with subchondral sclerosis and some regional fragmentation at the first carpometacarpal articulation. IMPRESSION: 1. No acute findings. 2. Osteopenia with advanced first [place] DJD. Electronically Signed   By: Lucrezia Europe M.D.   On: 02/23/2018 12:29   Ct Head Wo Contrast  Result Date: 02/23/2018 CLINICAL DATA:  Minor head trauma with high clinical risk. EXAM: CT HEAD WITHOUT CONTRAST TECHNIQUE: Contiguous axial images were obtained from the base of the skull through the vertex without intravenous contrast. COMPARISON:  02/16/2018 FINDINGS: Brain: No hemorrhage or hydrocephalus. There is a large isointense extra-axial mass filling the left middle  cranial fossa and extending into the frontal region, 6 cm by recent postcontrast CT. There is prominent mass effect on adjacent structures with midline shift measuring 11 mm. Extensive vasogenic edema on the left Vascular: No hyperdense vessel. There is atherosclerotic calcification Skull: Negative for fracture. Two osteomas on the forehead. There is a structure with scratch ossified structure from the inner table the left frontal bone with corticomedullary differentiation. Sinuses/Orbits: Bilateral cataract resection. IMPRESSION: 1. No acute/traumatic finding. 2. Known, large  presumed meningioma along the left frontotemporal convexity with vasogenic edema and 11 mm of midline shift. Electronically Signed   By: Monte Fantasia M.D.   On: 02/23/2018 11:40   Ct Head W & Wo Contrast  Result Date: 02/16/2018 CLINICAL DATA:  Difficulty with speech.  Headache and confusion. EXAM: CT HEAD WITHOUT AND WITH CONTRAST TECHNIQUE: Contiguous axial images were obtained from the base of the skull through the vertex without and with intravenous contrast CONTRAST:  24mL ISOVUE-300 IOPAMIDOL (ISOVUE-300) INJECTION 61% COMPARISON:  No prior cross-sectional imaging of the brain. FINDINGS: Brain: There is a large extra-axial mass, arising from the greater wing of the sphenoid on the LEFT. It displays avid, homogeneous enhancement with cross-sectional measurements of 53 x 60 x 61 mm. There is significant mass effect on the frontal and temporal lobes, and significant midline shift LEFT-to-RIGHT of 11 mm. Vasogenic edema extends posteriorly. Findings are consistent with a meningioma. Probably normal for age cerebral volume. Assessment for white matter disease is limited. No visible acute stroke or hemorrhage. Vascular: Flow voids are maintained.  Carotid atherosclerosis. Skull: Hyperostosis.  Marrow containing osteoma, LEFT frontal bone. Sinuses/Orbits: Clear sinuses.  BILATERAL cataract extraction. Other: None. IMPRESSION: 6 cm meningioma, LEFT greater sphenoid wing. Significant mass effect on surrounding structures. 11 mm of LEFT-to-RIGHT shift with vasogenic edema. Neuro surgical consultation is warranted. Attempts are being made to contact the ordering provider. Electronically Signed   By: Staci Righter M.D.   On: 02/16/2018 13:35   US Venous Img Lower Unilateral Right  Result Date: 02/10/2018 CLINICAL DATA:  Right lower extremity swelling EXAM: RIGHT LOWER EXTREMITY VENOUS DOPPLER ULTRASOUND TECHNIQUE: Gray-scale sonography with graded compression, as well as color Doppler and duplex ultrasound  were performed to evaluate the lower extremity deep venous systems from the level of the common femoral vein and including the common femoral, femoral, profunda femoral, popliteal and calf veins including the posterior tibial, peroneal and gastrocnemius veins when visible. The superficial great saphenous vein was also interrogated. Spectral Doppler was utilized to evaluate flow at rest and with distal augmentation maneuvers in the common femoral, femoral and popliteal veins. COMPARISON:  None. FINDINGS: Contralateral Common Femoral Vein: Respiratory phasicity is normal and symmetric with the symptomatic side. No evidence of thrombus. Normal compressibility. Common Femoral Vein: No evidence of thrombus. Normal compressibility, respiratory phasicity and response to augmentation. Saphenofemoral Junction: No evidence of thrombus. Normal compressibility and flow on color Doppler imaging. Profunda Femoral Vein: No evidence of thrombus. Normal compressibility and flow on color Doppler imaging. Femoral Vein: No evidence of thrombus. Normal compressibility, respiratory phasicity and response to augmentation. Popliteal Vein: Occlusive thrombus in the right popliteal vein which is noncompressible. Calf Veins: Occlusive thrombus extends into the posterior tibial vein. Clot noted in the gastrocnemius vein. Superficial Great Saphenous Vein: No evidence of thrombus. Normal compressibility. Venous Reflux:  None. Other Findings:  None. IMPRESSION: Occlusive thrombus within the right popliteal vein and extending into the posterior tibial and gastrocnemius veins in the right calf. Electronically Signed  By: Rolm Baptise M.D.   On: 02/10/2018 18:30   Dg Knee Complete 4 Views Left  Result Date: 02/23/2018 CLINICAL DATA:  fell last night; she says that the pt was on the all night and her right hand is swollen; Joann also says that the pt can not walk Patient not able to communicate well, redness anterior knee and swelling right  wrist EXAM: LEFT KNEE - COMPLETE 4+ VIEW COMPARISON:  None. FINDINGS: No evidence of fracture, dislocation, or joint effusion. Early marginal spurs from the patellar articular surface, lateral femoral condyle, and tibial plateau. Patchy femoral-popliteal arterial calcifications. Soft tissues are unremarkable. IMPRESSION: 1. Negative for fracture or other acute finding. 2. Degenerative changes as above. Electronically Signed   By: Lucrezia Europe M.D.   On: 02/23/2018 12:30    Microbiology: Recent Results (from the past 240 hour(s))  Urine culture     Status: Abnormal   Collection Time: 02/24/18  2:39 AM  Result Value Ref Range Status   Specimen Description   Final    URINE, RANDOM Performed at [place] [address] [place], [place] [address]    Special Requests   Final    NONE Performed at [facility], [place] [address] [place], [place] [address]    Culture MULTIPLE SPECIES PRESENT, SUGGEST RECOLLECTION (A)  Final   Report Status 02/25/2018 FINAL  Final     Labs: Basic Metabolic Panel: Recent Labs  Lab 02/23/18 1119 02/24/18 1005 02/25/18 0509 02/27/18 0509  NA 140 142 139 140  K 3.0* 4.0 4.5 4.2  CL 104 110 108 107  CO2 26 24 23 26   GLUCOSE 114* 113* 142* 113*  BUN 17 14 14 15   CREATININE 0.85 0.79 0.68 0.74  CALCIUM 9.8 9.8 9.4 9.2   Liver Function Tests: Recent Labs  Lab 02/23/18 1119  AST 38  ALT 17  ALKPHOS 57  BILITOT 1.3*  PROT 7.8  ALBUMIN 3.6   No results for input(s): LIPASE, AMYLASE in the last 168 hours. No results for input(s): AMMONIA in the last 168 hours. CBC: Recent Labs  Lab 02/23/18 1119 02/25/18 0509 02/27/18 0509  WBC 9.3 4.6 5.7  NEUTROABS 8.0* 4.0 4.5  HGB 13.7 12.3 11.4*  HCT 42.1 38.3 36.2  MCV 97.5 98.0 101.7*  PLT 206 202 193   Cardiac Enzymes: Recent Labs  Lab 02/23/18 1119  CKTOTAL 677*   BNP: BNP (last 3 results) Recent Labs    02/10/18 1844  BNP 151.1*    ProBNP (last 3  results) No results for input(s): PROBNP in the last 8760 hours.  CBG: No results for input(s): GLUCAP in the last 168 hours.     Signed:  Kayleen Memos, MD [facility] 02/28/2018, 12:34 PM

## 2018-03-01 ENCOUNTER — Telehealth: Payer: Self-pay

## 2018-03-01 NOTE — Telephone Encounter (Signed)
Spoke with patients daughter, Cynthia Shepard.   Admit date: 02/23/2018 Discharge date: 02/28/2018  Patient admitted for fall and meningioma, complicated by intermittent uncontrolled hypertension. Patient has f/u appt with Neuro on Monday, 03/07/18 per daughter.   Daughter states patient is currently inpatient at [facility] in [place] with an estimated length of stay of 15 days. Her care is being managed by Dr. Ronne Binning at the facility. Cynthia Shepard states patient is able to ambulate independently (has walker, rarely uses) and complete most ADLs. Daughter states they are discussing moving patient to SNF/ALF once rehab is complete. Advised to call with any concerns/questions.

## 2018-03-02 DIAGNOSIS — K220 Achalasia of cardia: Secondary | ICD-10-CM | POA: Diagnosis not present

## 2018-03-02 DIAGNOSIS — E876 Hypokalemia: Secondary | ICD-10-CM | POA: Diagnosis not present

## 2018-03-02 DIAGNOSIS — D320 Benign neoplasm of cerebral meninges: Secondary | ICD-10-CM | POA: Diagnosis not present

## 2018-03-02 DIAGNOSIS — K219 Gastro-esophageal reflux disease without esophagitis: Secondary | ICD-10-CM | POA: Diagnosis not present

## 2018-03-02 NOTE — Telephone Encounter (Signed)
Noted  

## 2018-03-06 DIAGNOSIS — I824Z1 Acute embolism and thrombosis of unspecified deep veins of right distal lower extremity: Secondary | ICD-10-CM | POA: Diagnosis not present

## 2018-03-07 DIAGNOSIS — D320 Benign neoplasm of cerebral meninges: Secondary | ICD-10-CM | POA: Diagnosis not present

## 2018-03-07 DIAGNOSIS — D496 Neoplasm of unspecified behavior of brain: Secondary | ICD-10-CM | POA: Diagnosis not present

## 2018-03-09 ENCOUNTER — Encounter: Payer: Self-pay | Admitting: Family Medicine

## 2018-03-09 DIAGNOSIS — D320 Benign neoplasm of cerebral meninges: Secondary | ICD-10-CM | POA: Diagnosis not present

## 2018-03-14 DIAGNOSIS — D320 Benign neoplasm of cerebral meninges: Secondary | ICD-10-CM | POA: Diagnosis not present

## 2018-03-14 DIAGNOSIS — K649 Unspecified hemorrhoids: Secondary | ICD-10-CM | POA: Diagnosis not present

## 2018-03-21 DIAGNOSIS — D320 Benign neoplasm of cerebral meninges: Secondary | ICD-10-CM | POA: Diagnosis not present

## 2018-03-23 DIAGNOSIS — D320 Benign neoplasm of cerebral meninges: Secondary | ICD-10-CM | POA: Diagnosis not present

## 2018-04-03 DEATH — deceased

## 2019-05-18 ENCOUNTER — Encounter: Payer: Self-pay | Admitting: Family Medicine
